# Patient Record
Sex: Female | Born: 1985 | Hispanic: No | Marital: Single | Smoking: Former smoker
Health system: Southern US, Community
[De-identification: ages and names within clinical notes are randomized; demographics above are authoritative.]

## PROBLEM LIST (undated history)

## (undated) DIAGNOSIS — R519 Headache, unspecified: Secondary | ICD-10-CM

## (undated) DIAGNOSIS — F419 Anxiety disorder, unspecified: Secondary | ICD-10-CM

## (undated) DIAGNOSIS — F329 Major depressive disorder, single episode, unspecified: Secondary | ICD-10-CM

## (undated) DIAGNOSIS — F32A Depression, unspecified: Secondary | ICD-10-CM

## (undated) HISTORY — PX: WISDOM TOOTH EXTRACTION: SHX21

---

## 2005-04-10 ENCOUNTER — Other Ambulatory Visit: Admission: RE | Admit: 2005-04-10 | Discharge: 2005-04-10 | Payer: Self-pay | Admitting: Obstetrics and Gynecology

## 2017-12-10 ENCOUNTER — Encounter (HOSPITAL_COMMUNITY): Payer: Self-pay | Admitting: Emergency Medicine

## 2017-12-10 ENCOUNTER — Ambulatory Visit (HOSPITAL_COMMUNITY)
Admission: EM | Admit: 2017-12-10 | Discharge: 2017-12-10 | Disposition: A | Discharge status: Home / Self Care | Payer: PRIVATE HEALTH INSURANCE | Attending: Family Medicine | Admitting: Family Medicine

## 2017-12-10 DIAGNOSIS — B9789 Other viral agents as the cause of diseases classified elsewhere: Secondary | ICD-10-CM

## 2017-12-10 DIAGNOSIS — J069 Acute upper respiratory infection, unspecified: Secondary | ICD-10-CM | POA: Diagnosis not present

## 2017-12-10 HISTORY — DX: Depression, unspecified: F32.A

## 2017-12-10 HISTORY — DX: Major depressive disorder, single episode, unspecified: F32.9

## 2017-12-10 HISTORY — DX: Anxiety disorder, unspecified: F41.9

## 2017-12-10 MED ORDER — FLUTICASONE PROPIONATE 50 MCG/ACT NA SUSP: 2.0000 | Freq: Every day | NASAL | 0 refills | Status: DC

## 2017-12-10 MED ORDER — IPRATROPIUM BROMIDE 0.06 % NA SOLN: 2.0000 | Freq: Four times a day (QID) | NASAL | 0 refills | Status: DC

## 2017-12-10 MED ORDER — BENZONATATE 100 MG PO CAPS: 100.0000 mg | ORAL_CAPSULE | Freq: Three times a day (TID) | ORAL | 0 refills | Status: DC

## 2017-12-10 NOTE — ED Triage Notes (Signed)
Pt c/o cough, fatigue, congestion, body aches since Sunday.

## 2017-12-10 NOTE — Discharge Instructions (Signed)
Tessalon for cough. Start flonase, atrovent nasal spray for nasal congestion/drainage. You can use over the counter nasal saline rinse such as neti pot for nasal congestion. Keep hydrated, your urine should be clear to pale yellow in color. Tylenol/motrin for fever and pain. Monitor for any worsening of symptoms, chest pain, shortness of breath, wheezing, swelling of the throat, follow up for reevaluation.  ° °For sore throat/cough try using a honey-based tea. Use 3 teaspoons of honey with juice squeezed from half lemon. Place shaved pieces of ginger into 1/2-1 cup of water and warm over stove top. Then mix the ingredients and repeat every 4 hours as needed. °

## 2017-12-10 NOTE — ED Provider Notes (Signed)
[facility]    CSN: [id number] Arrival date & time: 12/10/17  1045     History   Chief Complaint Chief Complaint  Patient presents with  . Cough    HPI Kim Fowler is a 32 y.o. female.   32 year old female comes in for 4-day history of URI symptoms.  Has had cough, mild rhinorrhea/nasal congestion, fatigue, sore throat.  States has had feelings of shortness of breath where she feels as if she is not getting enough oxygen, not able to take deep breaths.  Denies wheezing.  Had frontal headache that has since resolved.  State had right lower quadrant abdominal pain that is sharp, that has since resolved.  Mild nausea that has since resolved.  Denies vomiting.  Denies diarrhea.  Last bowel movement 2 days ago, no straining.  Denies fever, chills, night sweats.  Took Tylenol, albuterol inhaler with mild relief.  Never smoker.      Past Medical History:  Diagnosis Date  . Anxiety   . Depression     There are no active problems to display for this patient.   History reviewed. No pertinent surgical history.  OB History   None      Home Medications    Prior to Admission medications   Medication Sig Start Date End Date Taking? Authorizing Provider  ALPRAZolam Prudy Feeler) 0.25 MG tablet Take 0.25 mg by mouth at bedtime as needed for anxiety.   Yes [provider]  escitalopram (LEXAPRO) 10 MG tablet Take 10 mg by mouth daily.   Yes [provider]  benzonatate (TESSALON) 100 MG capsule Take 1 capsule (100 mg total) by mouth every 8 (eight) hours. 12/10/17   Cathie Hoops, Amy V, PA-C  fluticasone (FLONASE) 50 MCG/ACT nasal spray Place 2 sprays into both nostrils daily. 12/10/17   Cathie Hoops, Amy V, PA-C  ipratropium (ATROVENT) 0.06 % nasal spray Place 2 sprays into both nostrils 4 (four) times daily. 12/10/17   Belinda Fisher, PA-C    Family History No family history on file.  Social History Social History   Tobacco Use  . Smoking status: Never Smoker  Substance Use  Topics  . Alcohol use: Yes  . Drug use: Not on file     Allergies   Adhesive [tape] and Wellbutrin [bupropion]   Review of Systems Review of Systems  Reason unable to perform ROS: See HPI as above.     Physical Exam Triage Vital Signs ED Triage Vitals [12/10/17 1119]  Enc Vitals Group     BP 114/87     Pulse Rate 76     Resp 16     Temp 98.2 F (36.8 C)     Temp Source Oral     SpO2 100 %     Weight      Height      Head Circumference      Peak Flow      Pain Score      Pain Loc      Pain Edu?      Excl. in GC?    No data found.  Updated Vital Signs BP 114/87   Pulse 76   Temp 98.2 F (36.8 C) (Oral)   Resp 16   SpO2 100%   Physical Exam  Constitutional: She is oriented to person, place, and time. She appears well-developed and well-nourished. No distress.  HENT:  Head: Normocephalic and atraumatic.  Right Ear: Tympanic membrane, external ear and ear canal normal. Tympanic membrane is not  erythematous and not bulging.  Left Ear: Tympanic membrane, external ear and ear canal normal. Tympanic membrane is not erythematous and not bulging.  Nose: Nose normal. Right sinus exhibits no maxillary sinus tenderness and no frontal sinus tenderness. Left sinus exhibits no maxillary sinus tenderness and no frontal sinus tenderness.  Mouth/Throat: Uvula is midline, oropharynx is clear and moist and mucous membranes are normal.  Eyes: Pupils are equal, round, and reactive to light. Conjunctivae are normal.  Neck: Normal range of motion. Neck supple.  Cardiovascular: Normal rate, regular rhythm and normal heart sounds. Exam reveals no gallop and no friction rub.  No murmur heard. Pulmonary/Chest: Effort normal and breath sounds normal. She has no decreased breath sounds. She has no wheezes. She has no rhonchi. She has no rales.  Abdominal: Soft. Bowel sounds are normal. She exhibits no distension. There is no tenderness. There is no rebound and no guarding.    Lymphadenopathy:    She has no cervical adenopathy.  Neurological: She is alert and oriented to person, place, and time.  Skin: Skin is warm and dry.  Psychiatric: She has a normal mood and affect. Her behavior is normal. Judgment normal.     UC Treatments / Results  Labs (all labs ordered are listed, but only abnormal results are displayed) Labs Reviewed - No data to display  EKG None  Radiology No results found.  Procedures Procedures (including critical care time)  Medications Ordered in UC Medications - No data to display  Initial Impression / Assessment and Plan / UC Course  I have reviewed the triage vital signs and the nursing notes.  Pertinent labs & imaging results that were available during my care of the patient were reviewed by me and considered in my medical decision making (see chart for details).    Discussed with patient history and exam most consistent with viral URI. Symptomatic treatment as needed. Push fluids. Return precautions given.   Final Clinical Impressions(s) / UC Diagnoses   Final diagnoses:  Viral URI with cough    ED Prescriptions    Medication Sig Dispense Auth. Provider   benzonatate (TESSALON) 100 MG capsule Take 1 capsule (100 mg total) by mouth every 8 (eight) hours. 21 capsule Yu, Amy V, PA-C   fluticasone (FLONASE) 50 MCG/ACT nasal spray Place 2 sprays into both nostrils daily. 1 g Yu, Amy V, PA-C   ipratropium (ATROVENT) 0.06 % nasal spray Place 2 sprays into both nostrils 4 (four) times daily. 15 mL Threasa AlphaYu, Amy V, PA-C        Yu, Amy V, [place]PA-C 12/10/17 1159

## 2018-07-28 ENCOUNTER — Encounter: Payer: Self-pay | Admitting: Family Medicine

## 2018-11-03 ENCOUNTER — Ambulatory Visit (INDEPENDENT_AMBULATORY_CARE_PROVIDER_SITE_OTHER): Payer: BLUE CROSS/BLUE SHIELD | Admitting: Gastroenterology

## 2018-11-03 ENCOUNTER — Encounter: Payer: Self-pay | Admitting: Gastroenterology

## 2018-11-03 ENCOUNTER — Other Ambulatory Visit: Payer: Self-pay

## 2018-11-03 VITALS — Ht 65.0 in | Wt 180.0 lb

## 2018-11-03 DIAGNOSIS — R1013 Epigastric pain: Secondary | ICD-10-CM

## 2018-11-03 DIAGNOSIS — R110 Nausea: Secondary | ICD-10-CM

## 2018-11-03 NOTE — Progress Notes (Signed)
This patient contacted our office requesting a physician telemedicine consultation regarding clinical questions and/or test results.  If new patient, they were referred by self Participants on the conference : myself and patient   The patient consented to this consultation and was aware that a charge will be placed through their insurance.  I was in my office and the patient was at home   Encounter time:  Total time 35 minutes, with 28 minutes spent with patient on Doximity   _____________________________________________________________________________________________              [facility] Consult Note:  History: Marijo SanesKelly Pretlow 11/03/2018  Referring physician: [place], Julie M, NP  Reason for consult/chief complaint: No chief complaint on file.   Subjective  HPI: Seen by primary care 02/25/2018 for several days of vomiting and heartburn, especially at night.Rx PPI She recalls H pylori blood antibody test, then she took 2 weeks Abx (amoxicillin and ?) and sucralfate.  Was well until several days ago, then recurrence (worse than in Sept 2019) of lower sternal pain (difficult to characterize) and nausea.  Was constant overnight to midday 4 days ago.  Took Zantac and a leftover sucralfate, then two tylenol.  She vomited, went to UC, got zofran and pantoprazole 20 mg once in late evening.  Also had not had a BM in 6 days, which is unusual for her.  But then had one yesterday  Symptoms worse with eating No dysphagia or odynophagia  ROS:  Review of Systems  Constitutional: Negative for appetite change and unexpected weight change.  HENT: Negative for mouth sores and voice change.   Eyes: Negative for pain and redness.  Respiratory: Negative for cough and shortness of breath.   Cardiovascular: Negative for chest pain and palpitations.  Genitourinary: Negative for dysuria and hematuria.  Musculoskeletal: Negative for arthralgias and myalgias.  Skin: Negative  for pallor and rash.  Neurological: Negative for weakness and headaches.  Hematological: Negative for adenopathy.  Psychiatric/Behavioral:       Depression and anxiety - chronic and manageable     Past Medical History: Past Medical History:  Diagnosis Date  . Anxiety   . Depression      Past Surgical History: Past Surgical History:  Procedure Laterality Date  . WISDOM TOOTH EXTRACTION    no abdominal or pelvic surgery   Family History: Family History  Problem Relation Age of Onset  . Autoimmune disease Mother   . Sjogren's syndrome Mother   . Hypertension Father   . Bladder Cancer Maternal Grandfather   . Stroke Paternal Grandmother        140s  . Heart attack Paternal Grandfather   . Hypertension Paternal Grandfather   . Stomach cancer Neg Hx   . Colon cancer Neg Hx   . Pancreatic cancer Neg Hx   . Esophageal cancer Neg Hx     Social History: Social History   Socioeconomic History  . Marital status: Unknown    Spouse name: Not on file  . Number of children: Not on file  . Years of education: Not on file  . Highest education level: Not on file  Occupational History  . Not on file  Social Needs  . Financial resource strain: Not on file  . Food insecurity:    Worry: Not on file    Inability: Not on file  . Transportation needs:    Medical: Not on file    Non-medical: Not on file  Tobacco Use  . Smoking status: Former  Smoker  . Smokeless tobacco: Never Used  . Tobacco comment: in her her 27s. quit at age 89s, light smoker   Substance and Sexual Activity  . Alcohol use: Never    Frequency: Never    Comment: quit  . Drug use: Never  . Sexual activity: Yes    Partners: Male    Birth control/protection: I.U.D.  Lifestyle  . Physical activity:    Days per week: Not on file    Minutes per session: Not on file  . Stress: Not on file  Relationships  . Social connections:    Talks on phone: Not on file    Gets together: Not on file    Attends  religious service: Not on file    Active member of club or organization: Not on file    Attends meetings of clubs or organizations: Not on file    Relationship status: Not on file  Other Topics Concern  . Not on file  Social History Narrative  . 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  Allergies: Allergies  Allergen Reactions  . Adhesive [Tape]   . Wellbutrin [Bupropion]     Outpatient Meds: Current Outpatient Medications  Medication Sig Dispense Refill  . ALPRAZolam (XANAX) 0.25 MG tablet Take 0.25 mg by mouth at bedtime as needed for anxiety.    Marland Kitchen escitalopram (LEXAPRO) 10 MG tablet Take 10 mg by mouth daily.    Marland Kitchen levonorgestrel (MIRENA) 20 MCG/24HR IUD by Intrauterine route.    . pantoprazole (PROTONIX) 20 MG tablet Take 20 mg by mouth daily.     No current facility-administered medications for this visit.    No aspirin or NSAID   ___________________________________________________________________ Objective    No exam - virtual visit.  Looks well on videoconference  Labs:  No data to review.  She says no labs or imaging at urgent care  Assessment: Encounter Diagnoses  Name Primary?  . Abdominal pain, epigastric Yes  . Nausea without vomiting     Possible biliary colic, severe reflux, seems less likely to be ulcer or H. pylori related given its acute onset.  Plan:  She will re-dose the pantoprazole to suppertime  My office will contact her to schedule a right upper quadrant ultrasound to evaluate for gallstones.  Depending on those results, I can make a decision on whether or not she needs an upper endoscopy.  Thank you for the courtesy of this consult.  Please call me with any questions or concerns.  Charlie Pitter III  CC: Referring provider noted above

## 2018-11-03 NOTE — Addendum Note (Signed)
Addended by: Alexis Frock on: 11/03/2018 01:42 PM   Modules accepted: Orders

## 2018-11-03 NOTE — Patient Instructions (Signed)
If you are age 33 or older, your body mass index should be between 23-30. Your Body mass index is 29.95 kg/m. If this is out of the aforementioned range listed, please consider follow up with your Primary Care Provider.  If you are age 106 or younger, your body mass index should be between 19-25. Your Body mass index is 29.95 kg/m. If this is out of the aformentioned range listed, please consider follow up with your Primary Care Provider.   You have been scheduled for an abdominal ultrasound at [facility] (1st floor of hospital) on 11-10-2018 at 8am. Please arrive 15 minutes prior to your appointment for registration. Make certain not to have anything to eat or drink 6 hours prior to your appointment. Should you need to reschedule your appointment, please contact radiology at [phone]. This test typically takes about 30 minutes to perform.   It was a pleasure to see you today!  Dr. Myrtie Neither

## 2018-11-10 ENCOUNTER — Ambulatory Visit (HOSPITAL_COMMUNITY)
Admission: RE | Admit: 2018-11-10 | Discharge: 2018-11-10 | Disposition: A | Discharge status: Home / Self Care | Payer: BLUE CROSS/BLUE SHIELD | Source: Ambulatory Visit | Attending: Gastroenterology | Admitting: Gastroenterology

## 2018-11-10 ENCOUNTER — Other Ambulatory Visit: Payer: Self-pay

## 2018-11-10 DIAGNOSIS — R110 Nausea: Secondary | ICD-10-CM | POA: Diagnosis present

## 2018-11-10 DIAGNOSIS — R1013 Epigastric pain: Secondary | ICD-10-CM | POA: Diagnosis not present

## 2018-11-20 ENCOUNTER — Ambulatory Visit: Payer: Self-pay | Admitting: Surgery

## 2018-11-20 NOTE — H&P (View-Only) (Signed)
Kim Fowler Documented: 11/20/2018 10:09 AM Location: [facility] Patient #: [id number] DOB: 02/07/1986 Single / Language: English / Race: Undefined Female  History of Present Illness (Kim Duba A. [name] MD; 11/20/2018 11:02 AM) Patient words: Patient sent at the request of Kim Fowler for a two-month history of right upper quadrant abdominal pain after eating. The patient states she has frequent bouts of severe right upper quadrant abdominal pain without radiation. This is made worse with the fatty greasy foods. The attacks last minutes to hours and she is sore afterwards for many days. Ultrasound was done which showed multiple gallstones and a chronically thickened gallbladder and a common bile duct with 3 mm. Her last attack was last night and lasted about 2 hours.  The patient is a 33 year old female.   Past Surgical History (Kim Dockery, LPN; 11/20/2018 10:09 AM) Oral Surgery  Diagnostic Studies History (Kim Dockery, LPN; 11/20/2018 10:09 AM) Colonoscopy never Mammogram never Pap Smear 1-5 years ago  Allergies (Kim Dockery, LPN; 11/20/2018 10:10 AM) Wellbutrin *ANTIDEPRESSANTS* Adhesive Tape  Medication History (Kim Dockery, LPN; 11/20/2018 10:10 AM) ALPRAZolam (0.25MG Tablet, Oral) Active. Pantoprazole Sodium (20MG Tablet DR, Oral) Active. Escitalopram Oxalate (20MG Tablet, Oral) Active. Medications Reconciled  Social History (Kim Dockery, LPN; 11/20/2018 10:09 AM) Alcohol use Recently quit alcohol use. Caffeine use Carbonated beverages. Illicit drug use Prefer to discuss with provider. Tobacco use Former smoker.  Family History (Kim Dockery, LPN; 11/20/2018 10:09 AM) Cancer Mother. Colon Polyps Father. Depression Sister. Hypertension Father.  Pregnancy / Birth History (Kim Dockery, LPN; 11/20/2018 10:09 AM) Age at menarche 10 years. Contraceptive History Depo-provera, Intrauterine device, Oral contraceptives. Gravida 1 Irregular  periods Maternal age 21-25 Para 0  Other Problems (Kim Dockery, LPN; 11/20/2018 10:09 AM) Alcohol Abuse Anxiety Disorder Cholelithiasis Depression Gastric Ulcer Migraine Headache     Review of Systems (Kim Dockery LPN; 11/20/2018 10:09 AM) General Present- Fatigue, Night Sweats and Weight Gain. Not Present- Appetite Loss, Chills, Fever and Weight Loss. Skin Not Present- Change in Wart/Mole, Dryness, Hives, Jaundice, New Lesions, Non-Healing Wounds, Rash and Ulcer. HEENT Present- Wears glasses/contact lenses. Not Present- Earache, Hearing Loss, Hoarseness, Nose Bleed, Oral Ulcers, Ringing in the Ears, Seasonal Allergies, Sinus Pain, Sore Throat, Visual Disturbances and Yellow Eyes. Respiratory Not Present- Bloody sputum, Chronic Cough, Difficulty Breathing, Snoring and Wheezing. Breast Not Present- Breast Mass, Breast Pain, Nipple Discharge and Skin Changes. Cardiovascular Not Present- Chest Pain, Difficulty Breathing Lying Down, Leg Cramps, Palpitations, Rapid Heart Rate, Shortness of Breath and Swelling of Extremities. Gastrointestinal Present- Abdominal Pain, Bloating, Change in Bowel Habits, Excessive gas and Nausea. Not Present- Bloody Stool, Chronic diarrhea, Constipation, Difficulty Swallowing, Gets full quickly at meals, Hemorrhoids, Indigestion, Rectal Pain and Vomiting. Female Genitourinary Not Present- Frequency, Nocturia, Painful Urination, Pelvic Pain and Urgency. Musculoskeletal Not Present- Back Pain, Joint Pain, Joint Stiffness, Muscle Pain, Muscle Weakness and Swelling of Extremities. Neurological Not Present- Decreased Memory, Fainting, Headaches, Numbness, Seizures, Tingling, Tremor, Trouble walking and Weakness. Psychiatric Present- Anxiety, Change in Sleep Pattern and Depression. Not Present- Bipolar, Fearful and Frequent crying. Endocrine Present- Heat Intolerance. Not Present- Cold Intolerance, Excessive Hunger, Hair Changes, Hot flashes and New  Diabetes. Hematology Present- Easy Bruising. Not Present- Blood Thinners, Excessive bleeding, Gland problems, HIV and Persistent Infections.  Vitals (Kim Dockery LPN; 11/20/2018 10:10 AM) 11/20/2018 10:09 AM Weight: 177.4 lb Height: 65in Body Surface Area: 1.88 m Body Mass Index: 29.52 kg/m  Temp.: 98.1F(Temporal)  Pulse: 87 (Regular)  BP: 118/68 (Sitting, Left Arm, Standard)        Physical Exam (Kim Carmon A. [name] MD; 11/20/2018 11:03 AM)  General Mental Status-Alert. General Appearance-Consistent with stated age. Hydration-Well hydrated. Voice-Normal.  Head and Neck Head-normocephalic, atraumatic with no lesions or palpable masses.  Eye Eyeball - Bilateral-Extraocular movements intact. Sclera/Conjunctiva - Bilateral-No scleral icterus.  Chest and Lung Exam Chest and lung exam reveals -quiet, even and easy respiratory effort with no use of accessory muscles and on auscultation, normal breath sounds, no adventitious sounds and normal vocal resonance. Inspection Chest Wall - Normal. Back - normal.  Cardiovascular Cardiovascular examination reveals -on palpation PMI is normal in location and amplitude, no palpable S3 or S4. Normal cardiac borders., normal heart sounds, regular rate and rhythm with no murmurs, carotid auscultation reveals no bruits and normal pedal pulses bilaterally.  Abdomen Note: mild TTP RUQ NEGATIVE MURPHY'S SIGN  Neurologic Neurologic evaluation reveals -alert and oriented x 3 with no impairment of recent or remote memory. Mental Status-Normal.  Musculoskeletal Normal Exam - Left-Upper Extremity Strength Normal and Lower Extremity Strength Normal. Normal Exam - Right-Upper Extremity Strength Normal, Lower Extremity Weakness.    Assessment & Plan (Kim Frink A. [name] MD; 11/20/2018 11:04 AM)  SYMPTOMATIC CHOLELITHIASIS (K80.20) Impression: The procedure has been discussed with the patient. Risks of laparoscopic  cholecystectomy include bleeding, infection, bile duct injury, leak, death, open surgery, diarrhea, other surgery, organ injury, blood vessel injury, DVT, and additional care.   Recommend laparoscopic cholecystectomy. Risks, benefits and other treatment options discussed.  covid testing risks and potential exposure discussed  she is symptomatic and therefore surgery recommended  Current Plans You are being scheduled for surgery- Our schedulers will call you.  You should hear from our office's scheduling department within 5 working days about the location, date, and time of surgery. We try to make accommodations for patient's preferences in scheduling surgery, but sometimes the OR schedule or the surgeon's schedule prevents [place] from making those accommodations.  If you have not heard from our office [phone]) in 5 working days, call the office and ask for your surgeon's nurse.  If you have other questions about your diagnosis, plan, or surgery, call the office and ask for your surgeon's nurse.  Pt Education - Pamphlet Given - Laparoscopic Gallbladder Surgery: discussed with patient and provided information. Written instructions provided The anatomy & physiology of hepatobiliary & pancreatic function was discussed. The pathophysiology of gallbladder dysfunction was discussed. Natural history risks without surgery was discussed. I feel the risks of no intervention will lead to serious problems that outweigh the operative risks; therefore, I recommended cholecystectomy to remove the pathology. I explained laparoscopic techniques with possible need for an open approach. Probable cholangiogram to evaluate the bilary tract was explained as well.  Risks such as bleeding, infection, abscess, leak, injury to other organs, need for further treatment, heart attack, death, and other risks were discussed. I noted a good likelihood this will help address the problem. Possibility that this will  not correct all abdominal symptoms was explained. Goals of post-operative recovery were discussed as well. We will work to minimize complications. An educational handout further explaining the pathology and treatment options was given as well. Questions were answered. The patient expresses understanding & wishes to proceed with surgery.  Pt Education - [facility] Laparosopic Post Op HCI (Gross) Pt Education - [facility] Good Bowel Health (Gross) Pt Education - Laparoscopic Cholecystectomy: gallbladder

## 2018-11-20 NOTE — H&P (Signed)
Kim ApleyKelly D Hinnant Documented: 11/20/2018 10:09 AM Location: [facility] Patient #: [id number] DOB: 10/16/1985 Single / Language: [name]  History of Present Illness Kim Fowler(Kim Fowler A. Julias Mould MD; 11/20/2018 11:02 AM) Patient words: Patient sent at the request of Dr. Myrtie Neitheranis for a two-month history of right upper quadrant abdominal pain after eating. The patient states she has frequent bouts of severe right upper quadrant abdominal pain without radiation. This is made worse with the fatty greasy foods. The attacks last minutes to hours and she is sore afterwards for many days. Ultrasound was done which showed multiple gallstones and a chronically thickened gallbladder and a common bile duct with 3 mm. Her last attack was last night and lasted about 2 hours.  The patient is a 33 year old female.   Past Surgical History Kim Fowler(Kim Dockery, LPN; 0/4/54096/10/2018 81:1910:09 AM) Oral Surgery  Diagnostic Studies History Kim Fowler(Kim Dockery, LPN; 1/4/78296/10/2018 56:2110:09 AM) Colonoscopy never Mammogram never Pap Smear 1-5 years ago  Allergies Kim Fowler(Kim Dockery, LPN; 3/0/86576/10/2018 84:6910:10 AM) Wellbutrin *ANTIDEPRESSANTS* Adhesive Tape  Medication History Kim Fowler(Kim Dockery, LPN; 6/2/95286/10/2018 41:3210:10 AM) ALPRAZolam (0.25MG  Tablet, Oral) Active. Pantoprazole Sodium (20MG  Tablet DR, Oral) Active. Escitalopram Oxalate (20MG  Tablet, Oral) Active. Medications Reconciled  Social History Kim Fowler(Kim Dockery, LPN; 4/4/01026/10/2018 72:5310:09 AM) Alcohol use Recently quit alcohol use. Caffeine use Carbonated beverages. Illicit drug use Prefer to discuss with provider. Tobacco use Former smoker.  Family History Kim Fowler(Kim Dockery, LPN; 6/6/44036/10/2018 47:4210:09 AM) Cancer Mother. Colon Polyps Father. Depression Sister. Hypertension Father.  Pregnancy / Birth History Kim Fowler(Kim Dockery, LPN; 5/9/56386/10/2018 75:6410:09 AM) Age at menarche 10 years. Contraceptive History Depo-provera, Intrauterine device, Oral contraceptives. Gravida 1 Irregular  periods Maternal age 23-25 Para 0  Other Problems Kim Fowler(Kim Dockery, LPN; 3/3/29516/10/2018 88:4110:09 AM) Alcohol Abuse Anxiety Disorder Cholelithiasis Depression Gastric Ulcer Migraine Headache     Review of Systems Kim Fowler(Kim Dockery LPN; 6/6/06306/10/2018 16:0110:09 AM) General Present- Fatigue, Night Sweats and Weight Gain. Not Present- Appetite Loss, Chills, Fever and Weight Loss. Skin Not Present- Change in Wart/Mole, Dryness, Hives, Jaundice, New Lesions, Non-Healing Wounds, Rash and Ulcer. HEENT Present- Wears glasses/contact lenses. Not Present- Earache, Hearing Loss, Hoarseness, Nose Bleed, Oral Ulcers, Ringing in the Ears, Seasonal Allergies, Sinus Pain, Sore Throat, Visual Disturbances and Yellow Eyes. Respiratory Not Present- Bloody sputum, Chronic Cough, Difficulty Breathing, Snoring and Wheezing. Breast Not Present- Breast Mass, Breast Pain, Nipple Discharge and Skin Changes. Cardiovascular Not Present- Chest Pain, Difficulty Breathing Lying Down, Leg Cramps, Palpitations, Rapid Heart Rate, Shortness of Breath and Swelling of Extremities. Gastrointestinal Present- Abdominal Pain, Bloating, Change in Bowel Habits, Excessive gas and Nausea. Not Present- Bloody Stool, Chronic diarrhea, Constipation, Difficulty Swallowing, Gets full quickly at meals, Hemorrhoids, Indigestion, Rectal Pain and Vomiting. Female Genitourinary Not Present- Frequency, Nocturia, Painful Urination, Pelvic Pain and Urgency. Musculoskeletal Not Present- Back Pain, Joint Pain, Joint Stiffness, Muscle Pain, Muscle Weakness and Swelling of Extremities. Neurological Not Present- Decreased Memory, Fainting, Headaches, Numbness, Seizures, Tingling, Tremor, Trouble walking and Weakness. Psychiatric Present- Anxiety, Change in Sleep Pattern and Depression. Not Present- Bipolar, Fearful and Frequent crying. Endocrine Present- Heat Intolerance. Not Present- Cold Intolerance, Excessive Hunger, Hair Changes, Hot flashes and New  Diabetes. Hematology Present- Easy Bruising. Not Present- Blood Thinners, Excessive bleeding, Gland problems, HIV and Persistent Infections.  Vitals Kim Fowler(Kim Dockery LPN; 0/9/32356/10/2018 57:3210:10 AM) 11/20/2018 10:09 AM Weight: 177.4 lb Height: 65in Body Surface Area: 1.88 m Body Mass Index: 29.52 kg/m  Temp.: 98.41F(Temporal)  Pulse: 87 (Regular)  BP: 118/68 (Sitting, Left Arm, Standard)  Physical Exam ([name]; 11/20/2018 11:03 AM)  General Mental Status-Alert. General Appearance-Consistent with stated age. Hydration-Well hydrated. Voice-Normal.  Head and Neck Head-normocephalic, atraumatic with no lesions or palpable masses.  Eye Eyeball - Bilateral-Extraocular movements intact. Sclera/Conjunctiva - Bilateral-No scleral icterus.  Chest and Lung Exam Chest and lung exam reveals -quiet, even and easy respiratory effort with no use of accessory muscles and on auscultation, normal breath sounds, no adventitious sounds and normal vocal resonance. Inspection Chest Wall - Normal. Back - normal.  Cardiovascular Cardiovascular examination reveals -on palpation PMI is normal in location and amplitude, no palpable S3 or S4. Normal cardiac borders., normal heart sounds, regular rate and rhythm with no murmurs, carotid auscultation reveals no bruits and normal pedal pulses bilaterally.  Abdomen Note: mild TTP RUQ NEGATIVE MURPHY'S SIGN  Neurologic Neurologic evaluation reveals -alert and oriented x 3 with no impairment of recent or remote memory. Mental Status-Normal.  Musculoskeletal Normal Exam - Left-Upper Extremity Strength Normal and Lower Extremity Strength Normal. Normal Exam - Right-Upper Extremity Strength Normal, Lower Extremity Weakness.    Assessment & Plan ([name]; 11/20/2018 11:04 AM)  SYMPTOMATIC CHOLELITHIASIS (K80.20) Impression: The procedure has been discussed with the patient. Risks of laparoscopic  cholecystectomy include bleeding, infection, bile duct injury, leak, death, open surgery, diarrhea, other surgery, organ injury, blood vessel injury, DVT, and additional care.   Recommend laparoscopic cholecystectomy. Risks, benefits and other treatment options discussed.  covid testing risks and potential exposure discussed  she is symptomatic and therefore surgery recommended  Current Plans You are being scheduled for surgery- Our schedulers will call you.  You should hear from our office's scheduling department within 5 working days about the location, date, and time of surgery. We try to make accommodations for patient's preferences in scheduling surgery, but sometimes the OR schedule or the surgeon's schedule prevents [place] from making those accommodations.  If you have not heard from our office [phone]) in 5 working days, call the office and ask for your surgeon's nurse.  If you have other questions about your diagnosis, plan, or surgery, call the office and ask for your surgeon's nurse.  Pt Education - Pamphlet Given - Laparoscopic Gallbladder Surgery: discussed with patient and provided information. Written instructions provided The anatomy & physiology of hepatobiliary & pancreatic function was discussed. The pathophysiology of gallbladder dysfunction was discussed. Natural history risks without surgery was discussed. I feel the risks of no intervention will lead to serious problems that outweigh the operative risks; therefore, I recommended cholecystectomy to remove the pathology. I explained laparoscopic techniques with possible need for an open approach. Probable cholangiogram to evaluate the bilary tract was explained as well.  Risks such as bleeding, infection, abscess, leak, injury to other organs, need for further treatment, heart attack, death, and other risks were discussed. I noted a good likelihood this will help address the problem. Possibility that this will  not correct all abdominal symptoms was explained. Goals of post-operative recovery were discussed as well. We will work to minimize complications. An educational handout further explaining the pathology and treatment options was given as well. Questions were answered. The patient expresses understanding & wishes to proceed with surgery.  Pt Education - [facility] Laparosopic Post Op HCI (Gross) Pt Education - [facility] Good Bowel Health (Gross) Pt Education - Laparoscopic Cholecystectomy: gallbladder

## 2018-12-09 NOTE — Progress Notes (Signed)
[id number] Otto- [place], [place] - [address] [place] [address] Phone: [phone] Fax: [phone]      Your procedure is scheduled on July 1  [facility] Main Entr[facility] "A" at 1200 P.M., and check in at the Admitting office.  Call this number if you have problems the morning of surgery:  [phone]  Call [phone] if you have any questions prior to your surgery date Monday-Friday 8am-4pm    Remember:  Do not eat after midnight.  You may drink clear liquids until 1100 am the morning of surgery.   Clear liquids allowed are: Water, Non-Citrus Juices (without pulp), Carbonated Beverages, Clear Tea, Black Coffee Only, and Gatorade    Take these medicines the morning of surgery with A SIP OF WATER  ALPRAZolam (XANAX) escitalopram (LEXAPRO)  7 days prior to surgery STOP taking any Aspirin (unless otherwise instructed by your surgeon), Aleve, Naproxen, Ibuprofen, Motrin, Advil, Goody's, BC's, all herbal medications, fish oil, and all vitamins.    The Morning of Surgery  Do not wear jewelry, make-up or nail polish.  Do not wear lotions, powders, or perfumes/colognes, or deodorant  Do not shave 48 hours prior to surgery.  Men may shave face and neck.  Do not bring valuables to the hospital.  [facility] is not responsible for any belongings or valuables.  If you are a smoker, DO NOT Smoke 24 hours prior to surgery IF you wear a CPAP at night please bring your mask, tubing, and machine the morning of surgery   Remember that you must have someone to transport you home after your surgery, and remain with you for 24 hours if you are discharged the same day.   Contacts, glasses, hearing aids, dentures or bridgework may not be worn into surgery.    Leave your suitcase in the car.  After surgery it may be brought to your room.  For patients admitted to the hospital, discharge time will be determined by your treatment  team.  Patients discharged the day of surgery will not be allowed to drive home.    Special instructions:   [place]- Preparing For Surgery  Before surgery, you can play an important role. Because skin is not sterile, your skin needs to be as free of germs as possible. You can reduce the number of germs on your skin by washing with CHG (chlorahexidine gluconate) Soap before surgery.  CHG is an antiseptic cleaner which kills germs and bonds with the skin to continue killing germs even after washing.    Oral Hygiene is also important to reduce your risk of infection.  Remember - BRUSH YOUR TEETH THE MORNING OF SURGERY WITH YOUR REGULAR TOOTHPASTE  Please do not use if you have an allergy to CHG or antibacterial soaps. If your skin becomes reddened/irritated stop using the CHG.  Do not shave (including legs and underarms) for at least 48 hours prior to first CHG shower. It is OK to shave your face.  Please follow these instructions carefully.   1. Shower the NIGHT BEFORE SURGERY and the MORNING OF SURGERY with CHG Soap.   2. If you chose to wash your hair, wash your hair first as usual with your normal shampoo.  3. After you shampoo, rinse your hair and body thoroughly to remove the shampoo.  4. Use CHG as you would any other liquid soap. You can apply CHG directly to the skin and wash gently with a scrungie or a  clean washcloth.   5. Apply the CHG Soap to your body ONLY FROM THE NECK DOWN.  Do not use on open wounds or open sores. Avoid contact with your eyes, ears, mouth and genitals (private parts). Wash Face and genitals (private parts)  with your normal soap.   6. Wash thoroughly, paying special attention to the area where your surgery will be performed.  7. Thoroughly rinse your body with warm water from the neck down.  8. DO NOT shower/wash with your normal soap after using and rinsing off the CHG Soap.  9. Pat yourself dry with a CLEAN TOWEL.  10. Wear CLEAN PAJAMAS to bed  the night before surgery, wear comfortable clothes the morning of surgery  11. Place CLEAN SHEETS on your bed the night of your first shower and DO NOT SLEEP WITH PETS.    Day of Surgery:  Do not apply any deodorants/lotions.  Please wear clean clothes to the hospital/surgery center.   Remember to brush your teeth WITH YOUR REGULAR TOOTHPASTE.   Please read over the following fact sheets that you were given.

## 2018-12-10 ENCOUNTER — Other Ambulatory Visit: Payer: Self-pay

## 2018-12-10 ENCOUNTER — Encounter (HOSPITAL_COMMUNITY): Payer: Self-pay

## 2018-12-10 ENCOUNTER — Encounter (HOSPITAL_COMMUNITY)
Admission: RE | Admit: 2018-12-10 | Discharge: 2018-12-10 | Disposition: A | Discharge status: Home / Self Care | Payer: BC Managed Care – PPO | Source: Ambulatory Visit | Attending: Surgery | Admitting: Surgery

## 2018-12-10 DIAGNOSIS — Z01812 Encounter for preprocedural laboratory examination: Secondary | ICD-10-CM | POA: Insufficient documentation

## 2018-12-10 HISTORY — DX: Headache, unspecified: R51.9

## 2018-12-10 LAB — CBC WITH DIFFERENTIAL/PLATELET
Abs Immature Granulocytes: 0.03 10*3/uL (ref 0.00–0.07)
Basophils Absolute: 0 10*3/uL (ref 0.0–0.1)
Basophils Relative: 0 %
Eosinophils Absolute: 0.1 10*3/uL (ref 0.0–0.5)
Eosinophils Relative: 1 %
HCT: 42.4 % (ref 36.0–46.0)
Hemoglobin: 14.2 g/dL (ref 12.0–15.0)
Immature Granulocytes: 0 %
Lymphocytes Relative: 30 %
Lymphs Abs: 2.6 10*3/uL (ref 0.7–4.0)
MCH: 30.5 pg (ref 26.0–34.0)
MCHC: 33.5 g/dL (ref 30.0–36.0)
MCV: 91 fL (ref 80.0–100.0)
Monocytes Absolute: 0.6 10*3/uL (ref 0.1–1.0)
Monocytes Relative: 7 %
Neutro Abs: 5.3 10*3/uL (ref 1.7–7.7)
Neutrophils Relative %: 62 %
Platelets: 248 10*3/uL (ref 150–400)
RBC: 4.66 MIL/uL (ref 3.87–5.11)
RDW: 11.8 % (ref 11.5–15.5)
WBC: 8.7 10*3/uL (ref 4.0–10.5)
nRBC: 0 % (ref 0.0–0.2)

## 2018-12-10 LAB — COMPREHENSIVE METABOLIC PANEL
ALT: 17 U/L (ref 0–44)
AST: 20 U/L (ref 15–41)
Albumin: 4.1 g/dL (ref 3.5–5.0)
Alkaline Phosphatase: 72 U/L (ref 38–126)
Anion gap: 7 (ref 5–15)
BUN: 7 mg/dL (ref 6–20)
CO2: 25 mmol/L (ref 22–32)
Calcium: 9 mg/dL (ref 8.9–10.3)
Chloride: 106 mmol/L (ref 98–111)
Creatinine, Ser: 0.74 mg/dL (ref 0.44–1.00)
GFR calc Af Amer: >60 mL/min (ref >60)
GFR calc non Af Amer: >60 mL/min (ref >60)
Glucose, Bld: 100 mg/dL — ABNORMAL HIGH (ref 70–99)
Potassium: 4.2 mmol/L (ref 3.5–5.1)
Sodium: 138 mmol/L (ref 135–145)
Total Bilirubin: 1.3 mg/dL — ABNORMAL HIGH (ref 0.3–1.2)
Total Protein: 6.8 g/dL (ref 6.5–8.1)

## 2018-12-10 NOTE — Progress Notes (Signed)
Issued Ensure drink to pt. Thinking that Dr. Brantley Stage wanted that with the order in the chart stating ERAS. In reality there is currently no order for the drink. Call to [facility] [place] regarding the drink- she will inquire whether he wants the drink or not & if not pt. Will be contacted to hold drink.

## 2018-12-10 NOTE — Progress Notes (Signed)
PCP - J. [place]- [facility]    Chest x-ray - n/a EKG - n/a   Sleep Study - n/a CPAP - n/a  Fasting Blood Sugar - n/a Checks Blood Sugar _____ times a day  Blood Thinner Instructions:n/a Aspirin Instructions:  Anesthesia review: n/a  Patient denies shortness of breath, fever, cough and chest pain at PAT appointment   Patient verbalized understanding of instructions that were given to them at the PAT appointment. Patient was also instructed that they will need to review over the PAT instructions again at home before surgery.

## 2018-12-10 NOTE — Progress Notes (Signed)
Call back from [place] [facility] [place], Dr. Brantley Stage is agreeable to pt. Having the ENSURE presurg. drink. Order will be placed in the pt. 's chart.

## 2018-12-10 NOTE — Progress Notes (Signed)
[id number] Otto- [place], [place] - [address] [place] [address] Phone: [phone] Fax: [phone]      Your procedure is scheduled on July 1  [facility]tr[facility] "A" at 1200 P.M., and check in at the Admitting office.  Call this number if you have problems the morning of surgery:  [phone]  Call [phone] if you have any questions prior to your surgery date Monday-Friday 8am-4pm    Remember:  Do not eat after midnight.                          CONSUME ENSURE DRINK the a.m. of surgery- finish no later than 11a.m.    You may drink clear liquids until 1100 am the morning of surgery.   Clear liquids allowed are: Water, Non-Citrus Juices (without pulp), Carbonated Beverages, Clear Tea, Black Coffee Only, and Gatorade    Take these medicines the morning of surgery with A SIP OF WATER  ALPRAZolam (XANAX) escitalopram (LEXAPRO)  7 days prior to surgery STOP taking any Aspirin (unless otherwise instructed by your surgeon), Aleve, Naproxen, Ibuprofen, Motrin, Advil, Goody's, BC's, all herbal medications, fish oil, and all vitamins.    The Morning of Surgery  Do not wear jewelry, make-up or nail polish.  Do not wear lotions, powders, or perfumes/colognes, or deodorant  Do not shave 48 hours prior to surgery.  Men may shave face and neck.  Do not bring valuables to the hospital.  [facility] is not responsible for any belongings or valuables.  If you are a smoker, DO NOT Smoke 24 hours prior to surgery IF you wear a CPAP at night please bring your mask, tubing, and machine the morning of surgery   Remember that you must have someone to transport you home after your surgery, and remain with you for 24 hours if you are discharged the same day.   Contacts, glasses, hearing aids, dentures or bridgework may not be worn into surgery.    Leave your suitcase in the car.  After surgery it may be brought to your room.  For  patients admitted to the hospital, discharge time will be determined by your treatment team.  Patients discharged the day of surgery will not be allowed to drive home.    Special instructions:   [place]- Preparing For Surgery  Before surgery, you can play an important role. Because skin is not sterile, your skin needs to be as free of germs as possible. You can reduce the number of germs on your skin by washing with CHG (chlorahexidine gluconate) Soap before surgery.  CHG is an antiseptic cleaner which kills germs and bonds with the skin to continue killing germs even after washing.    Oral Hygiene is also important to reduce your risk of infection.  Remember - BRUSH YOUR TEETH THE MORNING OF SURGERY WITH YOUR REGULAR TOOTHPASTE  Please do not use if you have an allergy to CHG or antibacterial soaps. If your skin becomes reddened/irritated stop using the CHG.  Do not shave (including legs and underarms) for at least 48 hours prior to first CHG shower. It is OK to shave your face.  Please follow these instructions carefully.   1. Shower the NIGHT BEFORE SURGERY and the MORNING OF SURGERY with CHG Soap.   2. If you chose to wash your hair, wash your hair first as usual with your normal shampoo.  3. After  you shampoo, rinse your hair and body thoroughly to remove the shampoo.  4. Use CHG as you would any other liquid soap. You can apply CHG directly to the skin and wash gently with a scrungie or a clean washcloth.   5. Apply the CHG Soap to your body ONLY FROM THE NECK DOWN.  Do not use on open wounds or open sores. Avoid contact with your eyes, ears, mouth and genitals (private parts). Wash Face and genitals (private parts)  with your normal soap.   6. Wash thoroughly, paying special attention to the area where your surgery will be performed.  7. Thoroughly rinse your body with warm water from the neck down.  8. DO NOT shower/wash with your normal soap after using and rinsing off the  CHG Soap.  9. Pat yourself dry with a CLEAN TOWEL.  10. Wear CLEAN PAJAMAS to bed the night before surgery, wear comfortable clothes the morning of surgery  11. Place CLEAN SHEETS on your bed the night of your first shower and DO NOT SLEEP WITH PETS.    Day of Surgery:  Do not apply any deodorants/lotions.  Please wear clean clothes to the hospital/surgery center.   Remember to brush your teeth WITH YOUR REGULAR TOOTHPASTE.   Please read over the following fact sheets that you were given.

## 2018-12-12 ENCOUNTER — Other Ambulatory Visit (HOSPITAL_COMMUNITY)
Admission: RE | Admit: 2018-12-12 | Discharge: 2018-12-12 | Disposition: A | Discharge status: Home / Self Care | Payer: BC Managed Care – PPO | Source: Ambulatory Visit | Attending: Surgery | Admitting: Surgery

## 2018-12-12 DIAGNOSIS — Z1159 Encounter for screening for other viral diseases: Secondary | ICD-10-CM | POA: Insufficient documentation

## 2018-12-12 LAB — SARS CORONAVIRUS 2 (TAT 6-24 HRS): SARS Coronavirus 2: NEGATIVE

## 2018-12-16 ENCOUNTER — Encounter (HOSPITAL_COMMUNITY)
Admission: RE | Disposition: A | Discharge status: Home / Self Care | Payer: Self-pay | Source: Home / Self Care | Attending: Surgery

## 2018-12-16 ENCOUNTER — Encounter (HOSPITAL_COMMUNITY): Payer: Self-pay

## 2018-12-16 ENCOUNTER — Ambulatory Visit (HOSPITAL_COMMUNITY): Payer: BC Managed Care – PPO | Admitting: Certified Registered Nurse Anesthetist

## 2018-12-16 ENCOUNTER — Ambulatory Visit (HOSPITAL_COMMUNITY): Payer: BC Managed Care – PPO

## 2018-12-16 ENCOUNTER — Ambulatory Visit (HOSPITAL_COMMUNITY)
Admission: RE | Admit: 2018-12-16 | Discharge: 2018-12-16 | Disposition: A | Discharge status: Home / Self Care | Payer: BC Managed Care – PPO | Attending: Surgery | Admitting: Surgery

## 2018-12-16 ENCOUNTER — Other Ambulatory Visit: Payer: Self-pay

## 2018-12-16 DIAGNOSIS — Z8711 Personal history of peptic ulcer disease: Secondary | ICD-10-CM | POA: Insufficient documentation

## 2018-12-16 DIAGNOSIS — G43909 Migraine, unspecified, not intractable, without status migrainosus: Secondary | ICD-10-CM | POA: Insufficient documentation

## 2018-12-16 DIAGNOSIS — F419 Anxiety disorder, unspecified: Secondary | ICD-10-CM | POA: Diagnosis not present

## 2018-12-16 DIAGNOSIS — Z8371 Family history of colonic polyps: Secondary | ICD-10-CM | POA: Insufficient documentation

## 2018-12-16 DIAGNOSIS — F329 Major depressive disorder, single episode, unspecified: Secondary | ICD-10-CM | POA: Insufficient documentation

## 2018-12-16 DIAGNOSIS — Z818 Family history of other mental and behavioral disorders: Secondary | ICD-10-CM | POA: Diagnosis not present

## 2018-12-16 DIAGNOSIS — K8063 Calculus of gallbladder and bile duct with acute cholecystitis with obstruction: Secondary | ICD-10-CM

## 2018-12-16 DIAGNOSIS — Z87891 Personal history of nicotine dependence: Secondary | ICD-10-CM | POA: Diagnosis not present

## 2018-12-16 DIAGNOSIS — Z8249 Family history of ischemic heart disease and other diseases of the circulatory system: Secondary | ICD-10-CM | POA: Diagnosis not present

## 2018-12-16 DIAGNOSIS — Z888 Allergy status to other drugs, medicaments and biological substances status: Secondary | ICD-10-CM | POA: Diagnosis not present

## 2018-12-16 DIAGNOSIS — K8010 Calculus of gallbladder with chronic cholecystitis without obstruction: Secondary | ICD-10-CM | POA: Diagnosis present

## 2018-12-16 DIAGNOSIS — K828 Other specified diseases of gallbladder: Secondary | ICD-10-CM | POA: Insufficient documentation

## 2018-12-16 DIAGNOSIS — Z809 Family history of malignant neoplasm, unspecified: Secondary | ICD-10-CM | POA: Insufficient documentation

## 2018-12-16 HISTORY — PX: INTRAOPERATIVE CHOLANGIOGRAM: SHX5230

## 2018-12-16 HISTORY — PX: CHOLECYSTECTOMY: SHX55

## 2018-12-16 LAB — POCT PREGNANCY, URINE: Preg Test, Ur: NEGATIVE

## 2018-12-16 SURGERY — LAPAROSCOPIC CHOLECYSTECTOMY WITH INTRAOPERATIVE CHOLANGIOGRAM
Anesthesia: General | Site: Abdomen

## 2018-12-16 MED ORDER — FENTANYL CITRATE (PF) 250 MCG/5ML IJ SOLN
INTRAMUSCULAR | Status: DC | PRN
  Administered 2018-12-16: 100 ug via INTRAVENOUS
  Administered 2018-12-16: 50 ug via INTRAVENOUS
  Administered 2018-12-16: 100 ug via INTRAVENOUS

## 2018-12-16 MED ORDER — HYDROMORPHONE HCL 1 MG/ML IJ SOLN: 0.2500 mg | INTRAMUSCULAR | Status: DC | PRN

## 2018-12-16 MED ORDER — IBUPROFEN 800 MG PO TABS: 800.0000 mg | ORAL_TABLET | Freq: Three times a day (TID) | ORAL | 0 refills | Status: DC | PRN

## 2018-12-16 MED ORDER — HYDROMORPHONE HCL 1 MG/ML IJ SOLN
INTRAMUSCULAR | Status: DC | PRN
  Administered 2018-12-16 (×2): 0.5 mg via INTRAVENOUS

## 2018-12-16 MED ORDER — MIDAZOLAM HCL 2 MG/2ML IJ SOLN
INTRAMUSCULAR | Status: DC | PRN
  Administered 2018-12-16: 2 mg via INTRAVENOUS

## 2018-12-16 MED ORDER — PROMETHAZINE HCL 25 MG/ML IJ SOLN: 6.2500 mg | INTRAMUSCULAR | Status: DC | PRN

## 2018-12-16 MED ORDER — ROCURONIUM BROMIDE 100 MG/10ML IV SOLN
INTRAVENOUS | Status: DC | PRN
  Administered 2018-12-16: 50 mg via INTRAVENOUS

## 2018-12-16 MED ORDER — LIDOCAINE 2% (20 MG/ML) 5 ML SYRINGE
INTRAMUSCULAR | Status: AC
  Filled 2018-12-16: qty 5

## 2018-12-16 MED ORDER — SODIUM CHLORIDE 0.9 % IV SOLN
INTRAVENOUS | Status: DC | PRN
  Administered 2018-12-16: 17 mL

## 2018-12-16 MED ORDER — SUGAMMADEX SODIUM 200 MG/2ML IV SOLN
INTRAVENOUS | Status: DC | PRN
  Administered 2018-12-16: 162.8 mg via INTRAVENOUS

## 2018-12-16 MED ORDER — OXYCODONE HCL 5 MG/5ML PO SOLN: 5.0000 mg | Freq: Once | ORAL | Status: AC | PRN

## 2018-12-16 MED ORDER — SODIUM CHLORIDE 0.9 % IR SOLN
Status: DC | PRN
  Administered 2018-12-16: 1000 mL

## 2018-12-16 MED ORDER — GABAPENTIN 300 MG PO CAPS
300.0000 mg | ORAL_CAPSULE | ORAL | Status: AC
  Administered 2018-12-16: 300 mg via ORAL
  Filled 2018-12-16: qty 1

## 2018-12-16 MED ORDER — LIDOCAINE HCL (CARDIAC) PF 100 MG/5ML IV SOSY
PREFILLED_SYRINGE | INTRAVENOUS | Status: DC | PRN
  Administered 2018-12-16: 100 mg via INTRATRACHEAL

## 2018-12-16 MED ORDER — CELECOXIB 200 MG PO CAPS
200.0000 mg | ORAL_CAPSULE | ORAL | Status: AC
  Administered 2018-12-16: 200 mg via ORAL
  Filled 2018-12-16: qty 1

## 2018-12-16 MED ORDER — OXYCODONE HCL 5 MG PO TABS
5.0000 mg | ORAL_TABLET | Freq: Once | ORAL | Status: AC | PRN
  Administered 2018-12-16: 5 mg via ORAL

## 2018-12-16 MED ORDER — MEPERIDINE HCL 25 MG/ML IJ SOLN: 6.2500 mg | INTRAMUSCULAR | Status: DC | PRN

## 2018-12-16 MED ORDER — ONDANSETRON HCL 4 MG/2ML IJ SOLN
INTRAMUSCULAR | Status: AC
  Filled 2018-12-16: qty 2

## 2018-12-16 MED ORDER — OXYCODONE HCL 5 MG PO TABS: 5.0000 mg | ORAL_TABLET | Freq: Four times a day (QID) | ORAL | 0 refills | Status: DC | PRN

## 2018-12-16 MED ORDER — OXYCODONE HCL 5 MG PO TABS
ORAL_TABLET | ORAL | Status: AC
  Filled 2018-12-16: qty 1

## 2018-12-16 MED ORDER — 0.9 % SODIUM CHLORIDE (POUR BTL) OPTIME
TOPICAL | Status: DC | PRN
  Administered 2018-12-16: 1000 mL

## 2018-12-16 MED ORDER — ONDANSETRON HCL 4 MG/2ML IJ SOLN
INTRAMUSCULAR | Status: DC | PRN
  Administered 2018-12-16: 4 mg via INTRAVENOUS

## 2018-12-16 MED ORDER — HYDROMORPHONE HCL 1 MG/ML IJ SOLN
INTRAMUSCULAR | Status: AC
  Filled 2018-12-16: qty 0.5

## 2018-12-16 MED ORDER — PROPOFOL 10 MG/ML IV BOLUS
INTRAVENOUS | Status: AC
  Filled 2018-12-16: qty 20

## 2018-12-16 MED ORDER — ROCURONIUM BROMIDE 10 MG/ML (PF) SYRINGE
PREFILLED_SYRINGE | INTRAVENOUS | Status: AC
  Filled 2018-12-16: qty 10

## 2018-12-16 MED ORDER — BUPIVACAINE-EPINEPHRINE (PF) 0.25% -1:200000 IJ SOLN
INTRAMUSCULAR | Status: AC
  Filled 2018-12-16: qty 30

## 2018-12-16 MED ORDER — ACETAMINOPHEN 500 MG PO TABS
1000.0000 mg | ORAL_TABLET | ORAL | Status: AC
  Administered 2018-12-16: 1000 mg via ORAL
  Filled 2018-12-16: qty 2

## 2018-12-16 MED ORDER — BUPIVACAINE-EPINEPHRINE 0.25% -1:200000 IJ SOLN
INTRAMUSCULAR | Status: DC | PRN
  Administered 2018-12-16: 10 mL

## 2018-12-16 MED ORDER — CEFAZOLIN SODIUM-DEXTROSE 2-4 GM/100ML-% IV SOLN
2.0000 g | INTRAVENOUS | Status: AC
  Administered 2018-12-16: 2 g via INTRAVENOUS
  Filled 2018-12-16: qty 100

## 2018-12-16 MED ORDER — DEXAMETHASONE SODIUM PHOSPHATE 10 MG/ML IJ SOLN
INTRAMUSCULAR | Status: DC | PRN
  Administered 2018-12-16: 10 mg via INTRAVENOUS

## 2018-12-16 MED ORDER — DEXAMETHASONE SODIUM PHOSPHATE 10 MG/ML IJ SOLN
INTRAMUSCULAR | Status: AC
  Filled 2018-12-16: qty 1

## 2018-12-16 MED ORDER — FENTANYL CITRATE (PF) 250 MCG/5ML IJ SOLN
INTRAMUSCULAR | Status: AC
  Filled 2018-12-16: qty 5

## 2018-12-16 MED ORDER — LACTATED RINGERS IV SOLN
INTRAVENOUS | Status: DC
  Administered 2018-12-16 (×2): via INTRAVENOUS

## 2018-12-16 MED ORDER — PROPOFOL 10 MG/ML IV BOLUS
INTRAVENOUS | Status: DC | PRN
  Administered 2018-12-16: 200 mg via INTRAVENOUS

## 2018-12-16 MED ORDER — MIDAZOLAM HCL 2 MG/2ML IJ SOLN
INTRAMUSCULAR | Status: AC
  Filled 2018-12-16: qty 2

## 2018-12-16 SURGICAL SUPPLY — 41 items
APPLIER CLIP ROT 10 11.4 M/L (STAPLE) ×3
BLADE CLIPPER SURG (BLADE) IMPLANT
CANISTER SUCT 3000ML PPV (MISCELLANEOUS) ×3 IMPLANT
CHLORAPREP W/TINT 26 (MISCELLANEOUS) ×3 IMPLANT
CLIP APPLIE ROT 10 11.4 M/L (STAPLE) ×1 IMPLANT
COVER MAYO STAND STRL (DRAPES) ×6 IMPLANT
COVER SURGICAL LIGHT HANDLE (MISCELLANEOUS) ×3 IMPLANT
COVER WAND RF STERILE (DRAPES) ×3 IMPLANT
DERMABOND ADVANCED (GAUZE/BANDAGES/DRESSINGS) ×2
DERMABOND ADVANCED .7 DNX12 (GAUZE/BANDAGES/DRESSINGS) ×1 IMPLANT
DRAPE C-ARM 42X72 X-RAY (DRAPES) ×6 IMPLANT
DRAPE WARM FLUID 44X44 (DRAPES) ×3 IMPLANT
ELECT REM PT RETURN 9FT ADLT (ELECTROSURGICAL) ×3
ELECTRODE REM PT RTRN 9FT ADLT (ELECTROSURGICAL) ×1 IMPLANT
GLOVE BIO SURGEON STRL SZ8 (GLOVE) ×3 IMPLANT
GLOVE BIOGEL PI IND STRL 8 (GLOVE) ×1 IMPLANT
GLOVE BIOGEL PI INDICATOR 8 (GLOVE) ×2
GOWN STRL REUS W/ TWL LRG LVL3 (GOWN DISPOSABLE) ×2 IMPLANT
GOWN STRL REUS W/ TWL XL LVL3 (GOWN DISPOSABLE) ×1 IMPLANT
GOWN STRL REUS W/TWL LRG LVL3 (GOWN DISPOSABLE) ×4
GOWN STRL REUS W/TWL XL LVL3 (GOWN DISPOSABLE) ×2
KIT BASIN OR (CUSTOM PROCEDURE TRAY) ×3 IMPLANT
KIT TURNOVER KIT B (KITS) ×3 IMPLANT
NS IRRIG 1000ML POUR BTL (IV SOLUTION) ×3 IMPLANT
PAD ARMBOARD 7.5X6 YLW CONV (MISCELLANEOUS) ×3 IMPLANT
POUCH RETRIEVAL ECOSAC 10 (ENDOMECHANICALS) ×1 IMPLANT
POUCH RETRIEVAL ECOSAC 10MM (ENDOMECHANICALS) ×2
SCISSORS LAP 5X35 DISP (ENDOMECHANICALS) ×3 IMPLANT
SET CHOLANGIOGRAPH 5 50 .035 (SET/KITS/TRAYS/PACK) ×6 IMPLANT
SET IRRIG TUBING LAPAROSCOPIC (IRRIGATION / IRRIGATOR) ×3 IMPLANT
SET TUBE SMOKE EVAC HIGH FLOW (TUBING) ×3 IMPLANT
SLEEVE ENDOPATH XCEL 5M (ENDOMECHANICALS) ×3 IMPLANT
SPECIMEN JAR SMALL (MISCELLANEOUS) ×3 IMPLANT
SUT MNCRL AB 4-0 PS2 18 (SUTURE) ×3 IMPLANT
TOWEL GREEN STERILE (TOWEL DISPOSABLE) ×3 IMPLANT
TOWEL GREEN STERILE FF (TOWEL DISPOSABLE) ×3 IMPLANT
TRAY LAPAROSCOPIC MC (CUSTOM PROCEDURE TRAY) ×3 IMPLANT
TROCAR XCEL BLUNT TIP 100MML (ENDOMECHANICALS) ×3 IMPLANT
TROCAR XCEL NON-BLD 11X100MML (ENDOMECHANICALS) ×3 IMPLANT
TROCAR XCEL NON-BLD 5MMX100MML (ENDOMECHANICALS) ×3 IMPLANT
WATER STERILE IRR 1000ML POUR (IV SOLUTION) ×3 IMPLANT

## 2018-12-16 NOTE — Op Note (Signed)
Laparoscopic Cholecystectomy with IOC Procedure Note  Indications: This patient presents with symptomatic gallbladder disease and will undergo laparoscopic cholecystectomy. The procedure has been discussed with the patient. Operative and non operative treatments have been discussed. Risks of surgery include bleeding, infection,  Common bile duct injury,  Injury to the stomach,liver, colon,small intestine, abdominal wall,  Diaphragm,  Major blood vessels,  And the need for an open procedure.  Other risks include worsening of medical problems, death,  DVT and pulmonary embolism, and cardiovascular events.   Medical options have also been discussed. The patient has been informed of long term expectations of surgery and non surgical options,  The patient agrees to proceed.    Pre-operative Diagnosis: Calculus of gallbladder without mention of cholecystitis or obstruction  Post-operative Diagnosis: Same  Surgeon: Turner Daniels  MD  Assistants: [facility] RNFA   Anesthesia: General endotracheal anesthesia and Local anesthesia 0.25.% bupivacaine, with epinephrine  ASA Class: 2  Procedure Details  The patient was seen again in the Holding Room. The risks, benefits, complications, treatment options, and expected outcomes were discussed with the patient. The possibilities of reaction to medication, pulmonary aspiration, perforation of viscus, bleeding, recurrent infection, finding a normal gallbladder, the need for additional procedures, failure to diagnose a condition, the possible need to convert to an open procedure, and creating a complication requiring transfusion or operation were discussed with the patient. The patient and/or family concurred with the proposed plan, giving informed consent. The site of surgery properly noted/marked. The patient was taken to Operating Room, identified as Kim Fowler and the procedure verified as Laparoscopic Cholecystectomy with Intraoperative Cholangiograms.  A Time Out was held and the above information confirmed.  Prior to the induction of general anesthesia, antibiotic prophylaxis was administered. General endotracheal anesthesia was then administered and tolerated well. After the induction, the abdomen was prepped in the usual sterile fashion. The patient was positioned in the supine position with the left arm comfortably tucked, along with some reverse Trendelenburg.  Local anesthetic agent was injected into the skin near the umbilicus and an incision made. The midline fascia was incised and the Hasson technique was used to introduce a 12 mm port under direct vision. It was secured with a figure of eight Vicryl suture placed in the usual fashion. Pneumoperitoneum was then created with CO2 and tolerated well without any adverse changes in the patient's vital signs. Additional trocars were introduced under direct vision with an 11 mm trocar in the epigastrium and two  5 mm trocars in the right upper quadrant. All skin incisions were infiltrated with a local anesthetic agent before making the incision and placing the trocars.   The gallbladder was identified, the fundus grasped and retracted cephalad. Adhesions were lysed bluntly and with the electrocautery where indicated, taking care not to injure any adjacent organs or viscus. The infundibulum was grasped and retracted laterally, exposing the peritoneum overlying the triangle of Calot. This was then divided and exposed in a blunt fashion. The cystic duct was clearly identified and bluntly dissected circumferentially. The junctions of the gallbladder, cystic duct and common bile duct were clearly identified prior to the division of any linear structure.   An incision was made in the cystic duct and the cholangiogram catheter introduced. The catheter was secured using an endoclip. The study showed no stones and good visualization of the distal and proximal biliary tree. The catheter was then removed.   The  cystic duct was then  ligated with surgical clips  on the patient side and  clipped on the gallbladder side and divided. The cystic artery was identified, dissected free, ligated with clips and divided as well. Posterior cystic artery clipped and divided.  The gallbladder was dissected from the liver bed in retrograde fashion with the electrocautery. The gallbladder was removed. The liver bed was irrigated and inspected. Hemostasis was achieved with the electrocautery. Copious irrigation was utilized and was repeatedly aspirated until clear all particulate matter. Hemostasis was achieved with no signs  Of bleeding or bile leakage.  Pneumoperitoneum was completely reduced after viewing removal of the trocars under direct vision. The wound was thoroughly irrigated and the fascia was then closed with a figure of eight suture; the skin was then closed with 4 O MONOCRYL and a sterile dressing was applied.  Instrument, sponge, and needle counts were correct at closure and at the conclusion of the case.   Findings:  Cholelithiasis  Estimated Blood Loss: Minimal         Drains: NONE          Total IV Fluids: per record          Specimens: Gallbladder           Complications: None; patient tolerated the procedure well.         Disposition: PACU - hemodynamically stable.         Condition: stable

## 2018-12-16 NOTE — Discharge Instructions (Signed)
[facility] ______[facility] °LAPAROSCOPIC SURGERY: POST OP INSTRUCTIONS °Always review your discharge instruction sheet given to you by the facility where your surgery was performed. °IF YOU HAVE DISABILITY OR FAMILY LEAVE FORMS, YOU MUST BRING THEM TO THE OFFICE FOR PROCESSING.   °DO NOT GIVE THEM TO YOUR DOCTOR. ° °1. A prescription for pain medication may be given to you upon discharge.  Take your pain medication as prescribed, if needed.  If narcotic pain medicine is not needed, then you may take acetaminophen (Tylenol) or ibuprofen (Advil) as needed. °2. Take your usually prescribed medications unless otherwise directed. °3. If you need a refill on your pain medication, please contact your pharmacy.  They will contact our office to request authorization. Prescriptions will not be filled after 5pm or on week-ends. °4. You should follow a light diet the first few days after arrival home, such as soup and crackers, etc.  Be sure to include lots of fluids daily. °5. Most patients will experience some swelling and bruising in the area of the incisions.  Ice packs will help.  Swelling and bruising can take several days to resolve.  °6. It is common to experience some constipation if taking pain medication after surgery.  Increasing fluid intake and taking a stool softener (such as Colace) will usually help or prevent this problem from occurring.  A mild laxative (Milk of Magnesia or Miralax) should be taken according to package instructions if there are no bowel movements after 48 hours. °7. Unless discharge instructions indicate otherwise, you may remove your bandages 24-48 hours after surgery, and you may shower at that time.  You may have steri-strips (small skin tapes) in place directly over the incision.  These strips should be left on the skin for 7-10 days.  If your surgeon used skin glue on the incision, you may shower in 24 hours.  The glue will flake off over the next 2-3 weeks.  Any sutures or  staples will be removed at the office during your follow-up visit. °8. ACTIVITIES:  You may resume regular (light) daily activities beginning the next day--such as daily self-care, walking, climbing stairs--gradually increasing activities as tolerated.  You may have sexual intercourse when it is comfortable.  Refrain from any heavy lifting or straining until approved by your doctor. °a. You may drive when you are no longer taking prescription pain medication, you can comfortably wear a seatbelt, and you can safely maneuver your car and apply brakes. °b. RETURN TO WORK:  __________________________________________________________ °9. You should see your doctor in the office for a follow-up appointment approximately 2-3 weeks after your surgery.  Make sure that you call for this appointment within a day or two after you arrive home to insure a convenient appointment time. °10. OTHER INSTRUCTIONS: __________________________________________________________________________________________________________________________ __________________________________________________________________________________________________________________________ °WHEN TO CALL YOUR DOCTOR: °1. Fever over 101.0 °2. Inability to urinate °3. Continued bleeding from incision. °4. Increased pain, redness, or drainage from the incision. °5. Increasing abdominal pain ° °The clinic staff is available to answer your questions during regular business hours.  Please don’t hesitate to call and ask to speak to one of the nurses for clinical concerns.  If you have a medical emergency, go to the nearest emergency room or call 911.  A surgeon from [facility] is always on call at the hospital. °[address], [address], [place], [place]  [address] ? [address] [address], [place], [place]   [address] °[phone] ? [phone] ? FAX [phone] °Web site:   [email or web address] °

## 2018-12-16 NOTE — Anesthesia Preprocedure Evaluation (Signed)
Anesthesia Evaluation  Patient identified by MRN, date of birth, ID band Patient awake    Reviewed: Allergy & Precautions, NPO status , Patient's Chart, lab work & pertinent test results  Airway Mallampati: II  TM Distance: >3 FB Neck ROM: Full    Dental no notable dental hx. (+) Dental Advisory Given   Pulmonary former smoker,    Pulmonary exam normal breath sounds clear to auscultation       Cardiovascular negative cardio ROS Normal cardiovascular exam Rhythm:Regular Rate:Normal     Neuro/Psych  Headaches, PSYCHIATRIC DISORDERS Anxiety Depression    GI/Hepatic negative GI ROS, Neg liver ROS,   Endo/Other  negative endocrine ROS  Renal/GU negative Renal ROS     Musculoskeletal negative musculoskeletal ROS (+)   Abdominal   Peds  Hematology negative hematology ROS (+)   Anesthesia Other Findings   Reproductive/Obstetrics negative OB ROS                             Anesthesia Physical Anesthesia Plan  ASA: I  Anesthesia Plan: General   Post-op Pain Management:    Induction: Intravenous  PONV Risk Score and Plan: 4 or greater and Ondansetron, Dexamethasone, Midazolam and Treatment may vary due to age or medical condition  Airway Management Planned: Oral ETT  Additional Equipment: None  Intra-op Plan:   Post-operative Plan: Extubation in OR  Informed Consent: I have reviewed the patients History and Physical, chart, labs and discussed the procedure including the risks, benefits and alternatives for the proposed anesthesia with the patient or authorized representative who has indicated his/her understanding and acceptance.     Dental advisory given  Plan Discussed with: CRNA  Anesthesia Plan Comments:         Anesthesia Quick Evaluation

## 2018-12-16 NOTE — Interval H&P Note (Signed)
History and Physical Interval Note:  12/16/2018 2:13 PM  Kim Fowler  has presented today for surgery, with the diagnosis of gallstones.  The various methods of treatment have been discussed with the patient and family. After consideration of risks, benefits and other options for treatment, the patient has consented to  Procedure(s): LAPAROSCOPIC CHOLECYSTECTOMY WITH POSSIBLE INTRAOPERATIVE CHOLANGIOGRAM (N/A) as a surgical intervention.  The patient's history has been reviewed, patient examined, no change in status, stable for surgery.  I have reviewed the patient's chart and labs.  Questions were answered to the patient's satisfaction.     Piqua

## 2018-12-16 NOTE — Transfer of Care (Signed)
Immediate Anesthesia Transfer of Care Note  Patient: Kim Fowler  Procedure(s) Performed: LAPAROSCOPIC CHOLECYSTECTOMY (N/A Abdomen) Intraoperative Cholangiogram (N/A Abdomen)  Patient Location: PACU  Anesthesia Type:General  Level of Consciousness: awake, oriented, patient cooperative and responds to stimulation  Airway & Oxygen Therapy: Patient Spontanous Breathing and Patient connected to nasal cannula oxygen  Post-op Assessment: Report given to RN and Post -op Vital signs reviewed and stable  Post vital signs: Reviewed and stable  Last Vitals:  Vitals Value Taken Time  BP    Temp    Pulse    Resp    SpO2      Last Pain:  Vitals:   12/16/18 1222  TempSrc: Oral         Complications: No apparent anesthesia complications

## 2018-12-17 ENCOUNTER — Encounter (HOSPITAL_COMMUNITY): Payer: Self-pay | Admitting: Surgery

## 2018-12-17 NOTE — Anesthesia Postprocedure Evaluation (Signed)
Anesthesia Post Note  Patient: Kim Fowler  Procedure(s) Performed: LAPAROSCOPIC CHOLECYSTECTOMY (N/A Abdomen) Intraoperative Cholangiogram (N/A Abdomen)     Patient location during evaluation: PACU Anesthesia Type: General Level of consciousness: sedated and patient cooperative Pain management: pain level controlled Vital Signs Assessment: post-procedure vital signs reviewed and stable Respiratory status: spontaneous breathing Cardiovascular status: stable Anesthetic complications: no    Last Vitals:  Vitals:   12/16/18 1625 12/16/18 1640  BP: (!) 139/95 133/76  Pulse: 82 83  Resp: 18 16  Temp:  36.8 C  SpO2: 93% 98%    Last Pain:  Vitals:   12/16/18 1640  TempSrc:   PainSc: 2                  Nolon Nations

## 2019-11-15 IMAGING — RF INTRAOPERATIVE CHOLANGIOGRAM
1 series · 1 of 1 positions shown · non-contrast
Comparison: 11/10/2018

CLINICAL DATA: Acute cholecystitis

EXAM:
INTRAOPERATIVE CHOLANGIOGRAM
TECHNIQUE: Cholangiographic images from the C-arm fluoroscopic device were
submitted for interpretation post-operatively. Please see the
procedural report for the amount of contrast and the fluoroscopy
time utilized.

[Series 1: run · 1 of 1 slices shown]
[im 1/1]
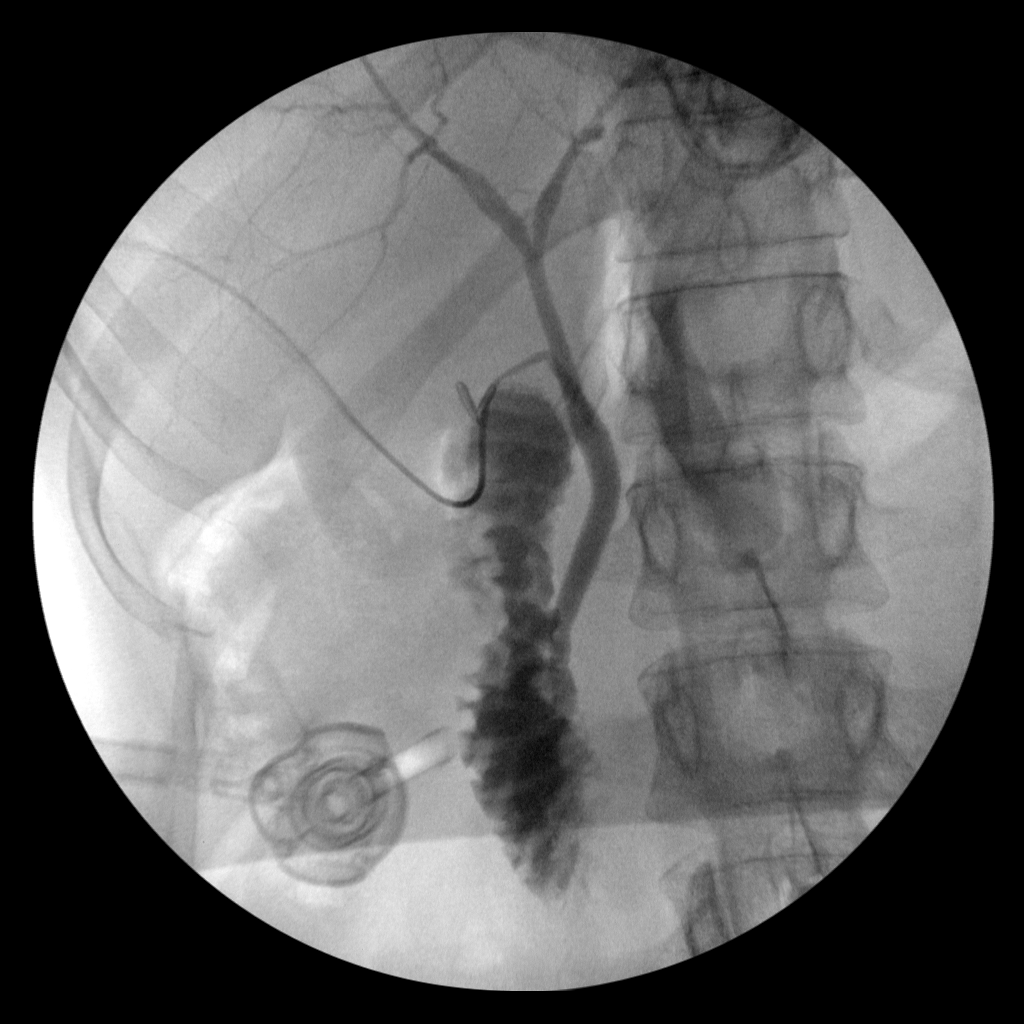

[1 of 1 positions shown; findings below may reference images not displayed]

FINDINGS: Intraoperative cholangiogram performed during the laparoscopic
procedure. The biliary confluence, common hepatic duct, residual
cystic duct, and common bile duct are patent. No dilatation,
obstruction, stricture or large filling defect. Contrast drains into
the duodenum.
IMPRESSION: Patent biliary system.

## 2022-04-26 DIAGNOSIS — F331 Major depressive disorder, recurrent, moderate: Secondary | ICD-10-CM | POA: Diagnosis not present

## 2022-05-10 DIAGNOSIS — S93401A Sprain of unspecified ligament of right ankle, initial encounter: Secondary | ICD-10-CM | POA: Diagnosis not present

## 2022-05-20 DIAGNOSIS — F331 Major depressive disorder, recurrent, moderate: Secondary | ICD-10-CM | POA: Diagnosis not present

## 2022-06-12 DIAGNOSIS — F331 Major depressive disorder, recurrent, moderate: Secondary | ICD-10-CM | POA: Diagnosis not present

## 2022-06-19 DIAGNOSIS — M25562 Pain in left knee: Secondary | ICD-10-CM | POA: Diagnosis not present

## 2022-06-19 DIAGNOSIS — M25561 Pain in right knee: Secondary | ICD-10-CM | POA: Diagnosis not present

## 2022-06-19 DIAGNOSIS — L739 Follicular disorder, unspecified: Secondary | ICD-10-CM | POA: Diagnosis not present

## 2022-06-19 DIAGNOSIS — G9332 Myalgic encephalomyelitis/chronic fatigue syndrome: Secondary | ICD-10-CM | POA: Diagnosis not present

## 2022-06-19 DIAGNOSIS — R799 Abnormal finding of blood chemistry, unspecified: Secondary | ICD-10-CM | POA: Diagnosis not present

## 2022-06-19 DIAGNOSIS — R5383 Other fatigue: Secondary | ICD-10-CM | POA: Diagnosis not present

## 2022-06-24 DIAGNOSIS — K6389 Other specified diseases of intestine: Secondary | ICD-10-CM | POA: Diagnosis not present

## 2022-06-24 DIAGNOSIS — Z1152 Encounter for screening for COVID-19: Secondary | ICD-10-CM | POA: Diagnosis not present

## 2022-06-24 DIAGNOSIS — R9431 Abnormal electrocardiogram [ECG] [EKG]: Secondary | ICD-10-CM | POA: Diagnosis not present

## 2022-06-24 DIAGNOSIS — R1084 Generalized abdominal pain: Secondary | ICD-10-CM | POA: Diagnosis not present

## 2022-06-24 DIAGNOSIS — K529 Noninfective gastroenteritis and colitis, unspecified: Secondary | ICD-10-CM | POA: Diagnosis not present

## 2022-06-25 DIAGNOSIS — R9431 Abnormal electrocardiogram [ECG] [EKG]: Secondary | ICD-10-CM | POA: Diagnosis not present

## 2022-06-26 DIAGNOSIS — K529 Noninfective gastroenteritis and colitis, unspecified: Secondary | ICD-10-CM | POA: Diagnosis not present

## 2022-07-03 DIAGNOSIS — F331 Major depressive disorder, recurrent, moderate: Secondary | ICD-10-CM | POA: Diagnosis not present

## 2022-07-09 DIAGNOSIS — K529 Noninfective gastroenteritis and colitis, unspecified: Secondary | ICD-10-CM | POA: Diagnosis not present

## 2022-07-09 DIAGNOSIS — F419 Anxiety disorder, unspecified: Secondary | ICD-10-CM | POA: Diagnosis not present

## 2022-07-31 DIAGNOSIS — F331 Major depressive disorder, recurrent, moderate: Secondary | ICD-10-CM | POA: Diagnosis not present

## 2022-08-21 DIAGNOSIS — K529 Noninfective gastroenteritis and colitis, unspecified: Secondary | ICD-10-CM | POA: Diagnosis not present

## 2022-09-04 DIAGNOSIS — F331 Major depressive disorder, recurrent, moderate: Secondary | ICD-10-CM | POA: Diagnosis not present

## 2022-09-10 DIAGNOSIS — F331 Major depressive disorder, recurrent, moderate: Secondary | ICD-10-CM | POA: Diagnosis not present

## 2022-09-25 DIAGNOSIS — F4310 Post-traumatic stress disorder, unspecified: Secondary | ICD-10-CM | POA: Diagnosis not present

## 2022-09-25 DIAGNOSIS — F339 Major depressive disorder, recurrent, unspecified: Secondary | ICD-10-CM | POA: Diagnosis not present

## 2022-09-26 DIAGNOSIS — F331 Major depressive disorder, recurrent, moderate: Secondary | ICD-10-CM | POA: Diagnosis not present

## 2022-09-27 DIAGNOSIS — Z6830 Body mass index (BMI) 30.0-30.9, adult: Secondary | ICD-10-CM | POA: Diagnosis not present

## 2022-09-27 DIAGNOSIS — Z1151 Encounter for screening for human papillomavirus (HPV): Secondary | ICD-10-CM | POA: Diagnosis not present

## 2022-09-27 DIAGNOSIS — Z01419 Encounter for gynecological examination (general) (routine) without abnormal findings: Secondary | ICD-10-CM | POA: Diagnosis not present

## 2022-09-27 DIAGNOSIS — Z113 Encounter for screening for infections with a predominantly sexual mode of transmission: Secondary | ICD-10-CM | POA: Diagnosis not present

## 2022-09-27 DIAGNOSIS — Z124 Encounter for screening for malignant neoplasm of cervix: Secondary | ICD-10-CM | POA: Diagnosis not present

## 2022-09-30 DIAGNOSIS — R112 Nausea with vomiting, unspecified: Secondary | ICD-10-CM | POA: Diagnosis not present

## 2022-10-04 DIAGNOSIS — R112 Nausea with vomiting, unspecified: Secondary | ICD-10-CM | POA: Diagnosis not present

## 2022-10-15 DIAGNOSIS — R112 Nausea with vomiting, unspecified: Secondary | ICD-10-CM | POA: Diagnosis not present

## 2022-10-23 DIAGNOSIS — R1013 Epigastric pain: Secondary | ICD-10-CM | POA: Diagnosis not present

## 2022-10-23 DIAGNOSIS — F4310 Post-traumatic stress disorder, unspecified: Secondary | ICD-10-CM | POA: Diagnosis not present

## 2022-10-23 DIAGNOSIS — R197 Diarrhea, unspecified: Secondary | ICD-10-CM | POA: Diagnosis not present

## 2022-10-23 DIAGNOSIS — R112 Nausea with vomiting, unspecified: Secondary | ICD-10-CM | POA: Diagnosis not present

## 2022-10-23 DIAGNOSIS — F339 Major depressive disorder, recurrent, unspecified: Secondary | ICD-10-CM | POA: Diagnosis not present

## 2022-10-29 DIAGNOSIS — K319 Disease of stomach and duodenum, unspecified: Secondary | ICD-10-CM | POA: Diagnosis not present

## 2022-10-29 DIAGNOSIS — Z538 Procedure and treatment not carried out for other reasons: Secondary | ICD-10-CM | POA: Diagnosis not present

## 2022-10-29 DIAGNOSIS — K3189 Other diseases of stomach and duodenum: Secondary | ICD-10-CM | POA: Diagnosis not present

## 2022-10-29 DIAGNOSIS — R1013 Epigastric pain: Secondary | ICD-10-CM | POA: Diagnosis not present

## 2022-10-29 DIAGNOSIS — R197 Diarrhea, unspecified: Secondary | ICD-10-CM | POA: Diagnosis not present

## 2022-10-29 DIAGNOSIS — R112 Nausea with vomiting, unspecified: Secondary | ICD-10-CM | POA: Diagnosis not present

## 2022-10-29 DIAGNOSIS — K640 First degree hemorrhoids: Secondary | ICD-10-CM | POA: Diagnosis not present

## 2022-10-30 DIAGNOSIS — K319 Disease of stomach and duodenum, unspecified: Secondary | ICD-10-CM | POA: Diagnosis not present

## 2022-11-07 DIAGNOSIS — F4310 Post-traumatic stress disorder, unspecified: Secondary | ICD-10-CM | POA: Diagnosis not present

## 2022-11-07 DIAGNOSIS — F339 Major depressive disorder, recurrent, unspecified: Secondary | ICD-10-CM | POA: Diagnosis not present

## 2022-11-12 DIAGNOSIS — R1013 Epigastric pain: Secondary | ICD-10-CM | POA: Diagnosis not present

## 2022-11-12 DIAGNOSIS — R112 Nausea with vomiting, unspecified: Secondary | ICD-10-CM | POA: Diagnosis not present

## 2022-11-12 DIAGNOSIS — R197 Diarrhea, unspecified: Secondary | ICD-10-CM | POA: Diagnosis not present

## 2022-11-19 DIAGNOSIS — F331 Major depressive disorder, recurrent, moderate: Secondary | ICD-10-CM | POA: Diagnosis not present

## 2022-12-03 DIAGNOSIS — F331 Major depressive disorder, recurrent, moderate: Secondary | ICD-10-CM | POA: Diagnosis not present

## 2022-12-25 DIAGNOSIS — F339 Major depressive disorder, recurrent, unspecified: Secondary | ICD-10-CM | POA: Diagnosis not present

## 2022-12-25 DIAGNOSIS — F4310 Post-traumatic stress disorder, unspecified: Secondary | ICD-10-CM | POA: Diagnosis not present

## 2023-01-22 DIAGNOSIS — F339 Major depressive disorder, recurrent, unspecified: Secondary | ICD-10-CM | POA: Diagnosis not present

## 2023-01-22 DIAGNOSIS — F4310 Post-traumatic stress disorder, unspecified: Secondary | ICD-10-CM | POA: Diagnosis not present

## 2023-03-03 ENCOUNTER — Telehealth: Payer: Self-pay | Admitting: Gastroenterology

## 2023-03-03 NOTE — Telephone Encounter (Signed)
Inbound call from patient requesting to know if we received previous GI records. Advised patient we have no received any records. Patient states she requested for them to be sent a few months ago. Patient requested an appointment, advised we would have to have records for review. Patient last seen with Dr. Myrtie Neither.

## 2023-03-07 ENCOUNTER — Telehealth: Payer: Self-pay | Admitting: Gastroenterology

## 2023-03-07 NOTE — Telephone Encounter (Signed)
Spoke with patient, states she was not satisfied with the care received Stated they were pushing for a colonoscopy and she would like to discuss different options.

## 2023-03-07 NOTE — Telephone Encounter (Signed)
Called patient to advise we have records, also need to know reason for request. Left voicemail.

## 2023-03-07 NOTE — Telephone Encounter (Signed)
Good afternoon Dr. Myrtie Neither,   We received a call from this patient wishing to establish care with [facility] due to not being satisfied with her previous GI provider. She has history with you in 10/2018. Patient was last seen with [facility] in 09/2022, she states they were requesting for her to have a colonoscopy but she would like to explore different options. Records were scanned into [employer] for your review. Would you please advise on scheduling?  Thank you.

## 2023-03-10 DIAGNOSIS — F339 Major depressive disorder, recurrent, unspecified: Secondary | ICD-10-CM | POA: Diagnosis not present

## 2023-03-10 DIAGNOSIS — F4310 Post-traumatic stress disorder, unspecified: Secondary | ICD-10-CM | POA: Diagnosis not present

## 2023-03-11 NOTE — Telephone Encounter (Signed)
Yes, she would be considered a new patient since seen once by telemedicine in May 2020.  She can be scheduled for next available visit understanding it is months from now.  H Danis

## 2023-03-12 ENCOUNTER — Encounter: Payer: Self-pay | Admitting: Gastroenterology

## 2023-03-12 NOTE — Telephone Encounter (Signed)
Called and LVM for patient to call back and schedule OV.

## 2023-04-07 DIAGNOSIS — F4310 Post-traumatic stress disorder, unspecified: Secondary | ICD-10-CM | POA: Diagnosis not present

## 2023-04-07 DIAGNOSIS — F339 Major depressive disorder, recurrent, unspecified: Secondary | ICD-10-CM | POA: Diagnosis not present

## 2023-04-08 ENCOUNTER — Encounter: Payer: Self-pay | Admitting: Gastroenterology

## 2023-04-08 ENCOUNTER — Ambulatory Visit: Payer: BC Managed Care – PPO | Admitting: Gastroenterology

## 2023-04-08 VITALS — BP 100/60 | HR 70 | Ht 64.0 in | Wt 163.0 lb

## 2023-04-08 DIAGNOSIS — R1011 Right upper quadrant pain: Secondary | ICD-10-CM | POA: Insufficient documentation

## 2023-04-08 DIAGNOSIS — R112 Nausea with vomiting, unspecified: Secondary | ICD-10-CM | POA: Insufficient documentation

## 2023-04-08 NOTE — Progress Notes (Signed)
____________________________________________________________  Attending physician addendum:  Thank you for sending this case to me. I have reviewed the entire note and agree with the plan.  She appears to have had an endoscopic workup by a [facility] affiliated practice in [place] where she is either currently living or was living earlier this year. As near as I can tell, she does not appear to have had a gastric emptying study.  If you confirm that is the case, then that should be added to the testing you have planned.  Amada Jupiter, MD  ____________________________________________________________

## 2023-04-08 NOTE — Progress Notes (Signed)
04/08/2023 Analiza Fien [id number] 1986/05/29   HISTORY OF PRESENT ILLNESS: This is a 37 year old female who was previously known to Dr. Myrtie Neither in 2020.  She had cholecystectomy in 2020 as well.  She is here today with primary complaint of constant nausea and episodes of vomiting with poor appetite.  Says that she cannot even get her body to want to eat and swallow food at times.  Has lost a lot of weight from 215 pounds down to 163 today according to her report.  Also has some right upper quadrant abdominal pain, but is s/p cholecystectomy and once again the nausea/vomiting/inability to eat is her largest complaint.  She takes Zofran as needed, but says that the ODT form does not taste great.  She does not feel like she has any heartburn or reflux.  TSH normal earlier this year.  She has occasional constipation, sometimes normal stools, occasional diarrhea.  No bloating or gas.  Does remark on previous heavy ETOH history.  CT scan of the abdomen and pelvis with contrast January 2024 at [facility] showed mild circumferential thickening of the wall of the transverse, descending, and sigmoid colon consistent with infectious or inflammatory colitis.  EGD and colonoscopy 10/2022 through [facility]:  Impression: Erythema of the antrum Normal esophagus Normal duodenum Poor bowel preparation Status post incomplete colonoscopy up to the level of the ascending colon Grade 1 internal hemorrhoids   A. STOMACH, ANTRUM, BIOPSY:             REACTIVE GASTROPATHY.             NEGATIVE FOR HELICOBACTER ORGANISMS.   B. COLON, RANDOM, BIOPSY:             NORMAL COLONIC MUCOSA.  Past Medical History:  Diagnosis Date   Anxiety    Depression    Headache    " once in a blue moon , migraine    Past Surgical History:  Procedure Laterality Date   CHOLECYSTECTOMY N/A 12/16/2018   Procedure: LAPAROSCOPIC CHOLECYSTECTOMY;  Surgeon: Harriette Bouillon, MD;  Location: [facility] OR;  Service: General;  Laterality: N/A;    INTRAOPERATIVE CHOLANGIOGRAM N/A 12/16/2018   Procedure: Intraoperative Cholangiogram;  Surgeon: Harriette Bouillon, MD;  Location: [facility] OR;  Service: General;  Laterality: N/A;   WISDOM TOOTH EXTRACTION      reports that she has quit smoking. She has never used smokeless tobacco. She reports that she does not drink alcohol and does not use drugs. family history includes Autoimmune disease in her mother; Bladder Cancer in her maternal grandfather; Heart attack in her paternal grandfather; Hypertension in her father and paternal grandfather; Sjogren's syndrome in her mother; Stroke in her paternal grandmother. Allergies  Allergen Reactions   Mirtazapine     Increased suicidal thoughts    Wellbutrin [Bupropion] Other (See Comments)    Made anxiety and depression worse    Adhesive [Tape] Itching, Dermatitis and Rash      Outpatient Encounter Medications as of 04/08/2023  Medication Sig   ALPRAZolam (XANAX) 0.25 MG tablet Take 0.25 mg by mouth 2 (two) times daily as needed for anxiety.    escitalopram (LEXAPRO) 10 MG tablet Take 10 mg by mouth daily.   lamoTRIgine (LAMICTAL) 150 MG tablet Take 150 mg by mouth daily.   levonorgestrel (MIRENA) 20 MCG/24HR IUD by Intrauterine route.   ondansetron (ZOFRAN-ODT) 4 MG disintegrating tablet Take 4 mg by mouth every 8 (eight) hours as needed for nausea or vomiting.   [DISCONTINUED] escitalopram (LEXAPRO)  20 MG tablet Take 20 mg by mouth at bedtime.    [DISCONTINUED] ibuprofen (ADVIL) 800 MG tablet Take 1 tablet (800 mg total) by mouth every 8 (eight) hours as needed. (Patient not taking: Reported on 04/08/2023)   [DISCONTINUED] oxyCODONE (OXY IR/ROXICODONE) 5 MG immediate release tablet Take 1 tablet (5 mg total) by mouth every 6 (six) hours as needed for severe pain. (Patient not taking: Reported on 04/08/2023)   No facility-administered encounter medications on file as of 04/08/2023.    REVIEW OF SYSTEMS  : All other systems reviewed and negative except  where noted in the History of Present Illness.   PHYSICAL EXAM: BP 100/60   Pulse 70   Ht 5\' 4"  (1.626 m)   Wt 163 lb (73.9 kg)   SpO2 97%   BMI 27.98 kg/m  General: Well developed white female in no acute distress Head: Normocephalic and atraumatic Eyes:  Sclerae anicteric, conjunctiva pink. Ears: Normal auditory acuity Lungs: Clear throughout to auscultation; no W/R/R. Heart: Regular rate and rhythm; no M/R/G. Abdomen: Soft, non-distended.  BS present.  Mild diffuse TTP. Musculoskeletal: Symmetrical with no gross deformities  Skin: No lesions on visible extremities Extremities: No edema  Neurological: Alert oriented x 4, grossly non-focal Psychological:  Alert and cooperative. Normal mood and affect  ASSESSMENT AND PLAN: *37 year old female with primary complaint of constant nausea and episodes of vomiting with poor appetite.  Says that she cannot even get her body to want to eat and swallow food at times.  Also has some right upper quadrant abdominal pain, but is s/p cholecystectomy and once again the nausea/vomiting/inability to eat is her largest complaint.  She had EGD, colonoscopy, and CT scan within the past several months.  Question if her pain could be some scar tissue.  Will check LFTs at her request due to strong history of alcohol use in the past.  Liver looks fine on CT scan.  Will also check a fasting a.m. cortisol and will check a CT scan of the head.  TSH is normal.   CC:  [place], Julie M, NP

## 2023-04-08 NOTE — Patient Instructions (Signed)
You have been scheduled for a CT scan of the abdomen and pelvis at [facility]. You are scheduled on Tuesday 04/15/23 at 9 am. You should arrive 15 minutes prior to your appointment time for registration.    Please follow the written instructions below on the day of your exam:   1) Do not eat anything after 5 am (4 hours prior to your test)   You may take any medications as prescribed with a small amount of water, if necessary. If you take any of the following medications: METFORMIN, GLUCOPHAGE, GLUCOVANCE, AVANDAMET, RIOMET, FORTAMET, ACTOPLUS MET, JANUMET, GLUMETZA or METAGLIP, you MAY be asked to HOLD this medication 48 hours AFTER the exam.   The purpose of you drinking the oral contrast is to aid in the visualization of your intestinal tract. The contrast solution may cause some diarrhea. Depending on your individual set of symptoms, you may also receive an intravenous injection of x-ray contrast/dye. Plan on being at [facility] for 45 minutes or longer, depending on the type of exam you are having performed.   If you have any questions regarding your exam or if you need to reschedule, you may call [facility] at [phone] between the hours of 8:00 am and 5:00 pm, Monday-Friday.

## 2023-04-10 NOTE — Progress Notes (Signed)
That certainly makes this much more challenging.  Since the ondansetron she is taking does not seem to be working very well, it would be reasonable to offer her at least a brief trial of metoclopramide (2 to 3 weeks) if she is agreeable after understanding potential side effects and risks and if no contraindications.  If she does that, I would recommend taking it instead rather than in addition to the ondansetron.  Ellwood Dense MD

## 2023-04-15 ENCOUNTER — Ambulatory Visit: Payer: BC Managed Care – PPO

## 2023-04-15 DIAGNOSIS — R112 Nausea with vomiting, unspecified: Secondary | ICD-10-CM

## 2023-04-15 DIAGNOSIS — R519 Headache, unspecified: Secondary | ICD-10-CM | POA: Diagnosis not present

## 2023-04-15 MED ORDER — IOHEXOL 300 MG/ML  SOLN
100.0000 mL | Freq: Once | INTRAMUSCULAR | Status: AC | PRN
Start: 2023-04-15 — End: 2023-04-15
  Administered 2023-04-15: 75 mL via INTRAVENOUS

## 2023-04-17 ENCOUNTER — Other Ambulatory Visit (HOSPITAL_COMMUNITY): Payer: BC Managed Care – PPO

## 2023-04-18 DIAGNOSIS — R112 Nausea with vomiting, unspecified: Secondary | ICD-10-CM | POA: Diagnosis not present

## 2023-04-18 DIAGNOSIS — R1011 Right upper quadrant pain: Secondary | ICD-10-CM | POA: Diagnosis not present

## 2023-04-19 LAB — HEPATIC FUNCTION PANEL
ALT: 13 [IU]/L (ref 0–32)
AST: 16 [IU]/L (ref 0–40)
Albumin: 4.4 g/dL (ref 3.9–4.9)
Alkaline Phosphatase: 75 [IU]/L (ref 44–121)
Bilirubin Total: 1.1 mg/dL (ref 0.0–1.2)
Bilirubin, Direct: 0.23 mg/dL (ref 0.00–0.40)
Total Protein: 6.6 g/dL (ref 6.0–8.5)

## 2023-04-19 LAB — CORTISOL: Cortisol: 13.6 ug/dL (ref 6.2–19.4)

## 2023-04-24 ENCOUNTER — Telehealth: Payer: Self-pay | Admitting: Gastroenterology

## 2023-04-24 NOTE — Telephone Encounter (Signed)
See results note. 

## 2023-04-24 NOTE — Telephone Encounter (Signed)
Patient returned call, please advise.  

## 2023-04-25 NOTE — Telephone Encounter (Signed)
Please let her know that her CT of her head/brain was normal.  LFTs and fasting cortisol also normal.  From here we can consider performing gastric emptying scan although she did not feel the delayed emptying was the issue.  Or we could try brief trial of metoclopramide/Reglan for 2 to 3 weeks to use instead of the Zofran for nausea, could take it regularly in the morning or twice daily.  Let me know her thoughts.

## 2023-04-25 NOTE — Telephone Encounter (Signed)
The pt returned call regarding her CT results.  See below.   She wishes to think about adding reglan, do some research and call back if she decides to go that route.

## 2023-04-28 NOTE — Telephone Encounter (Signed)
Kim Fowler the pt has decided to try Reglan. Ok to proceed with prescription?     Leta Baptist, PA-C 04/22/2023  4:56 PM EST     Please let her know that her CT of her head/brain was normal.  LFTs and fasting cortisol also normal.  From here we can consider performing gastric emptying scan although she did not feel the delayed emptying was the issue.  Or we could try brief trial of metoclopramide/Reglan for 2 to 3 weeks to use instead of the Zofran for nausea, could take it regularly in the morning or twice daily.  Let me know her thoughts.

## 2023-04-28 NOTE — Telephone Encounter (Signed)
Inbound call from patient, states she would like to proceed with Reglan.

## 2023-04-29 ENCOUNTER — Other Ambulatory Visit: Payer: Self-pay

## 2023-04-29 MED ORDER — METOCLOPRAMIDE HCL 5 MG PO TABS: 5.0000 mg | ORAL_TABLET | Freq: Three times a day (TID) | ORAL | 0 refills | Status: AC

## 2023-04-29 NOTE — Telephone Encounter (Signed)
The patient returned call and has been notified of this information and all questions answered.  She will update [place] in 3-4 weeks. She will call if she has any side effects of Tardive dyskinsia.

## 2023-04-29 NOTE — Telephone Encounter (Signed)
Left message on machine to call back  Prescription has been sent

## 2023-05-01 ENCOUNTER — Encounter: Payer: BC Managed Care – PPO | Admitting: Gastroenterology

## 2023-05-05 DIAGNOSIS — F339 Major depressive disorder, recurrent, unspecified: Secondary | ICD-10-CM | POA: Diagnosis not present

## 2023-05-05 DIAGNOSIS — F4310 Post-traumatic stress disorder, unspecified: Secondary | ICD-10-CM | POA: Diagnosis not present

## 2023-05-06 DIAGNOSIS — F331 Major depressive disorder, recurrent, moderate: Secondary | ICD-10-CM | POA: Diagnosis not present

## 2023-05-19 DIAGNOSIS — F331 Major depressive disorder, recurrent, moderate: Secondary | ICD-10-CM | POA: Diagnosis not present

## 2023-06-23 ENCOUNTER — Ambulatory Visit: Payer: BC Managed Care – PPO | Admitting: Gastroenterology

## 2023-06-26 DIAGNOSIS — F331 Major depressive disorder, recurrent, moderate: Secondary | ICD-10-CM | POA: Diagnosis not present

## 2023-06-30 DIAGNOSIS — F339 Major depressive disorder, recurrent, unspecified: Secondary | ICD-10-CM | POA: Diagnosis not present

## 2023-06-30 DIAGNOSIS — F4310 Post-traumatic stress disorder, unspecified: Secondary | ICD-10-CM | POA: Diagnosis not present

## 2023-07-08 DIAGNOSIS — F331 Major depressive disorder, recurrent, moderate: Secondary | ICD-10-CM | POA: Diagnosis not present

## 2023-07-11 DIAGNOSIS — R0981 Nasal congestion: Secondary | ICD-10-CM | POA: Diagnosis not present

## 2023-07-11 DIAGNOSIS — J069 Acute upper respiratory infection, unspecified: Secondary | ICD-10-CM | POA: Diagnosis not present

## 2023-07-22 DIAGNOSIS — F331 Major depressive disorder, recurrent, moderate: Secondary | ICD-10-CM | POA: Diagnosis not present

## 2023-07-28 DIAGNOSIS — F4310 Post-traumatic stress disorder, unspecified: Secondary | ICD-10-CM | POA: Diagnosis not present

## 2023-07-28 DIAGNOSIS — F339 Major depressive disorder, recurrent, unspecified: Secondary | ICD-10-CM | POA: Diagnosis not present

## 2023-08-15 DIAGNOSIS — J029 Acute pharyngitis, unspecified: Secondary | ICD-10-CM | POA: Diagnosis not present

## 2023-08-25 DIAGNOSIS — F339 Major depressive disorder, recurrent, unspecified: Secondary | ICD-10-CM | POA: Diagnosis not present

## 2023-08-25 DIAGNOSIS — F4310 Post-traumatic stress disorder, unspecified: Secondary | ICD-10-CM | POA: Diagnosis not present

## 2023-09-04 DIAGNOSIS — F331 Major depressive disorder, recurrent, moderate: Secondary | ICD-10-CM | POA: Diagnosis not present

## 2023-09-17 DIAGNOSIS — F4310 Post-traumatic stress disorder, unspecified: Secondary | ICD-10-CM | POA: Diagnosis not present

## 2023-09-17 DIAGNOSIS — F339 Major depressive disorder, recurrent, unspecified: Secondary | ICD-10-CM | POA: Diagnosis not present
